# Patient Record
Sex: Female | Born: 2005 | Race: Black or African American | Hispanic: No | Marital: Single | Smoking: Never smoker
Health system: Southern US, Community
[De-identification: ages and names within clinical notes are randomized; demographics above are authoritative.]

## PROBLEM LIST (undated history)

## (undated) ENCOUNTER — Ambulatory Visit: Admission: EM | Payer: Medicaid Other | Source: Home / Self Care

## (undated) ENCOUNTER — Emergency Department (HOSPITAL_COMMUNITY): Admission: EM | Payer: Medicaid Other

## (undated) ENCOUNTER — Ambulatory Visit: Payer: Self-pay | Source: Home / Self Care

## (undated) ENCOUNTER — Ambulatory Visit: Admission: EM | Payer: Medicaid Other

## (undated) DIAGNOSIS — I10 Essential (primary) hypertension: Secondary | ICD-10-CM

## (undated) DIAGNOSIS — Z789 Other specified health status: Secondary | ICD-10-CM

## (undated) DIAGNOSIS — A599 Trichomoniasis, unspecified: Secondary | ICD-10-CM

## (undated) DIAGNOSIS — N390 Urinary tract infection, site not specified: Secondary | ICD-10-CM

## (undated) DIAGNOSIS — A749 Chlamydial infection, unspecified: Secondary | ICD-10-CM

## (undated) HISTORY — PX: NO PAST SURGERIES: SHX2092

## (undated) HISTORY — PX: TONSILLECTOMY: SUR1361

---
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
## 2023-01-09 ENCOUNTER — Encounter: Payer: Self-pay | Admitting: Obstetrics

## 2023-01-09 ENCOUNTER — Ambulatory Visit (INDEPENDENT_AMBULATORY_CARE_PROVIDER_SITE_OTHER): Payer: Medicaid Other | Admitting: Obstetrics

## 2023-01-09 DIAGNOSIS — Z0000 Encounter for general adult medical examination without abnormal findings: Secondary | ICD-10-CM

## 2023-01-09 DIAGNOSIS — I10 Essential (primary) hypertension: Secondary | ICD-10-CM | POA: Diagnosis not present

## 2023-01-09 DIAGNOSIS — Z8759 Personal history of other complications of pregnancy, childbirth and the puerperium: Secondary | ICD-10-CM | POA: Diagnosis not present

## 2023-01-09 NOTE — Progress Notes (Signed)
Pt reports seeing spots and having visual disturbances. Pt states she stopped taking her BP medication because it "makes my head feel heavy". Denies any headaches. No other concerns at this time.

## 2023-01-09 NOTE — Progress Notes (Unsigned)
Patient ID: Sheila Brennan, female   DOB: 2006-05-22, 17 y.o.   MRN: [id number]  No chief complaint on file.   HPI Sheila Brennan is a 17 y.o. female.  Visual disturbances after starting BP med.  Stopped taking medication and symptoms went away. HPI  Past Medical History:  Diagnosis Date   Chlamydia    Hypertension    Trichomonas infection    UTI (urinary tract infection)     Past Surgical History:  Procedure Laterality Date   NO PAST SURGERIES      Family History  Problem Relation Age of Onset   Hypertension Mother    Diabetes Mother    Other Father        work accident   Asthma Neg Hx    Cancer Neg Hx    Hearing loss Neg Hx    Stroke Neg Hx     Social History Social History   Tobacco Use   Smoking status: Never   Smokeless tobacco: Never  Vaping Use   Vaping Use: Never used  Substance Use Topics   Alcohol use: Not Currently    Comment: not since confirmed pregnancy   Drug use: Not Currently    No Known Allergies  Current Outpatient Medications  Medication Sig Dispense Refill   Blood Pressure Monitoring (BLOOD PRESSURE KIT) DEVI 1 kit by Does not apply route once a week. (Patient not taking: Reported on 11/04/2022) 1 each 0   enalapril (VASOTEC) 10 MG tablet Take 1 tablet (10 mg total) by mouth 2 (two) times daily. (Patient not taking: Reported on 01/09/2023) 60 tablet 11   furosemide (LASIX) 20 MG tablet Take 1 tablet (20 mg total) by mouth 2 (two) times daily. (Patient not taking: Reported on 11/04/2022) 12 tablet 0   ibuprofen (ADVIL) 600 MG tablet Take 1 tablet (600 mg total) by mouth every 6 (six) hours. (Patient not taking: Reported on 11/04/2022) 30 tablet 0   Misc. Devices (GOJJI WEIGHT SCALE) MISC 1 Device by Does not apply route every 30 (thirty) days. (Patient not taking: Reported on 11/04/2022) 1 each 0   Prenat-Fe Poly-Methfol-FA-DHA (VITAFOL ULTRA) 29-0.6-0.4-200 MG CAPS Take 1 capsule by mouth daily. (Patient not taking: Reported on 01/09/2023) 30  capsule 11   No current facility-administered medications for this visit.    Review of Systems Review of Systems Constitutional: negative for fatigue and weight loss Respiratory: negative for cough and wheezing Cardiovascular: negative for chest pain, fatigue and palpitations Gastrointestinal: negative for abdominal pain and change in bowel habits Genitourinary:negative Integument/breast: negative for nipple discharge Musculoskeletal:negative for myalgias Neurological: negative for gait problems and tremors Behavioral/Psych: negative for abusive relationship, depression Endocrine: negative for temperature intolerance      Blood pressure 131/89, pulse 82, height 5' 5"$  (1.651 m), weight 191 lb 8 oz (86.9 kg), last menstrual period 12/22/2022, not currently breastfeeding.  Physical Exam Physical Exam General:   alert  Skin:   no rash or abnormalities  Lungs:   clear to auscultation bilaterally  Heart:   regular rate and rhythm, S1, S2 normal, no murmur, click, rub or gallop  Breasts:   normal without suspicious masses, skin or nipple changes or axillary nodes  Abdomen:  normal findings: no organomegaly, soft, non-tender and no hernia  Pelvis:  External genitalia: normal general appearance Urinary system: urethral meatus normal and bladder without fullness, nontender Vaginal: normal without tenderness, induration or masses Cervix: normal appearance Adnexa: normal bimanual exam Uterus: anteverted and non-tender, normal size    ***%  of *** min visit spent on counseling and coordination of care.   Data Reviewed ***  Assessment     ***    Plan    Orders Placed This Encounter  Procedures   Ambulatory referral to Internal Medicine    Referral Priority:   Routine    Referral Type:   Consultation    Referral Reason:   Specialty Services Required    Requested Specialty:   Internal Medicine    Number of Visits Requested:   1   No orders of the defined types were placed in  this encounter.    Shelly Bombard, MD 01/09/2023 10:26 AM

## 2023-01-10 LAB — COMPREHENSIVE METABOLIC PANEL
ALT: 16 IU/L (ref 0–32)
AST: 16 IU/L (ref 0–40)
Albumin/Globulin Ratio: 1.5 (ref 1.2–2.2)
Albumin: 4.1 g/dL (ref 4.0–5.0)
Alkaline Phosphatase: 97 IU/L (ref 42–106)
BUN/Creatinine Ratio: 10 (ref 9–23)
BUN: 10 mg/dL (ref 6–20)
Bilirubin Total: 0.5 mg/dL (ref 0.0–1.2)
CO2: 22 mmol/L (ref 20–29)
Calcium: 9.6 mg/dL (ref 8.7–10.2)
Chloride: 104 mmol/L (ref 96–106)
Creatinine, Ser: 0.96 mg/dL (ref 0.57–1.00)
Globulin, Total: 2.7 g/dL (ref 1.5–4.5)
Glucose: 87 mg/dL (ref 70–99)
Potassium: 4.1 mmol/L (ref 3.5–5.2)
Sodium: 141 mmol/L (ref 134–144)
Total Protein: 6.8 g/dL (ref 6.0–8.5)
eGFR: 88 mL/min/{1.73_m2} (ref >59)

## 2023-01-10 LAB — CBC
Hematocrit: 36 % (ref 34.0–46.6)
Hemoglobin: 11.4 g/dL (ref 11.1–15.9)
MCH: 25.3 pg — ABNORMAL LOW (ref 26.6–33.0)
MCHC: 31.7 g/dL (ref 31.5–35.7)
MCV: 80 fL (ref 79–97)
Platelets: 369 10*3/uL (ref 150–450)
RBC: 4.5 x10E6/uL (ref 3.77–5.28)
RDW: 15 % (ref 11.7–15.4)
WBC: 7 10*3/uL (ref 3.4–10.8)

## 2023-01-16 ENCOUNTER — Ambulatory Visit
Admission: EM | Admit: 2023-01-16 | Discharge: 2023-01-16 | Disposition: A | Discharge status: Home / Self Care | Payer: Medicaid Other | Attending: Physician Assistant | Admitting: Physician Assistant

## 2023-01-16 DIAGNOSIS — Z113 Encounter for screening for infections with a predominantly sexual mode of transmission: Secondary | ICD-10-CM | POA: Diagnosis present

## 2023-01-16 LAB — POCT URINE PREGNANCY: Preg Test, Ur: NEGATIVE

## 2023-01-16 NOTE — ED Provider Notes (Signed)
[facility]    CSN: [id number] Arrival date & time: 01/16/23  1459      History   Chief Complaint Chief Complaint  Patient presents with   SEXUALLY TRANSMITTED DISEASE    HPI Sheila Brennan is a 17 y.o. female.   17 year old female presents for STI screening.  Patient indicates that she is currently for STI screening.  She indicates she does not have symptoms.  She relates she has not have any vaginal discharge or odor.  She declines to have HIV and RPR testing at this time.  She indicates her last period was January 17 and normal.  She indicates her last intercourse was 1 week ago and it was unprotected.  She is without fever or chills.     Past Medical History:  Diagnosis Date   Chlamydia    Hypertension    Trichomonas infection    UTI (urinary tract infection)     Patient Active Problem List   Diagnosis Date Noted   Hypertension in pregnancy, preeclampsia, severe, delivered 09/04/2022   Vaginal delivery 09/04/2022   Chronic hypertension with superimposed pre-eclampsia 09/03/2022   Excessive weight gain during pregnancy in second trimester 07/08/2022   Chronic hypertension affecting pregnancy 04/15/2022   Chlamydia trachomatis infection in pregnancy in first trimester 04/15/2022   High risk teen pregnancy 03/24/2022    Past Surgical History:  Procedure Laterality Date   NO PAST SURGERIES      OB History     Gravida  1   Para  1   Term  0   Preterm  1   AB  0   Living  1      SAB  0   IAB  0   Ectopic  0   Multiple  0   Live Births  1            Home Medications    Prior to Admission medications   Medication Sig Start Date End Date Taking? Authorizing Provider  Blood Pressure Monitoring (BLOOD PRESSURE KIT) DEVI 1 kit by Does not apply route once a week. Patient not taking: Reported on 11/04/2022 03/24/22   Constant, Peggy, MD  enalapril (VASOTEC) 10 MG tablet Take 1 tablet (10 mg total) by mouth 2 (two) times  daily. Patient not taking: Reported on 01/09/2023 11/04/22   Shelly Bombard, MD  furosemide (LASIX) 20 MG tablet Take 1 tablet (20 mg total) by mouth 2 (two) times daily. Patient not taking: Reported on 11/04/2022 09/07/22   Chancy Milroy, MD  ibuprofen (ADVIL) 600 MG tablet Take 1 tablet (600 mg total) by mouth every 6 (six) hours. Patient not taking: Reported on 11/04/2022 09/07/22   Chancy Milroy, MD  Misc. Devices (GOJJI WEIGHT SCALE) MISC 1 Device by Does not apply route every 30 (thirty) days. Patient not taking: Reported on 11/04/2022 03/24/22   Constant, Peggy, MD  Prenat-Fe Poly-Methfol-FA-DHA (VITAFOL ULTRA) 29-0.6-0.4-200 MG CAPS Take 1 capsule by mouth daily. Patient not taking: Reported on 01/09/2023 04/15/22   Leftwich-Kirby, Kathie Dike, CNM    Family History Family History  Problem Relation Age of Onset   Hypertension Mother    Diabetes Mother    Other Father        work accident   Asthma Neg Hx    Cancer Neg Hx    Hearing loss Neg Hx    Stroke Neg Hx     Social History Social History   Tobacco Use   Smoking status:  Never   Smokeless tobacco: Never  Vaping Use   Vaping Use: Never used  Substance Use Topics   Alcohol use: Not Currently    Comment: not since confirmed pregnancy   Drug use: Not Currently     Allergies   Patient has no known allergies.   Review of Systems Review of Systems   Physical Exam Triage Vital Signs ED Triage Vitals  Enc Vitals Group     BP 01/16/23 1610 (!) 149/99     Pulse Rate 01/16/23 1610 68     Resp 01/16/23 1610 16     Temp 01/16/23 1610 98.3 F (36.8 C)     Temp Source 01/16/23 1610 Oral     SpO2 01/16/23 1610 98 %     Weight --      Height --      Head Circumference --      Peak Flow --      Pain Score 01/16/23 1611 0     Pain Loc --      Pain Edu? --      Excl. in [place]? --    No data found.  Updated Vital Signs BP (!) 149/99 (BP Location: Left Arm)   Pulse 68   Temp 98.3 F (36.8 C) (Oral)   Resp 16    LMP 12/10/2022 (Approximate)   SpO2 98%   Breastfeeding No   Visual Acuity Right Eye Distance:   Left Eye Distance:   Bilateral Distance:    Right Eye Near:   Left Eye Near:    Bilateral Near:     Physical Exam Constitutional:      Appearance: Normal appearance.  Neurological:     Mental Status: She is alert.      UC Treatments / Results  Labs (all labs ordered are listed, but only abnormal results are displayed) Labs Reviewed  POCT URINE PREGNANCY  CERVICOVAGINAL ANCILLARY ONLY    EKG   Radiology No results found.  Procedures Procedures (including critical care time)  Medications Ordered in UC Medications - No data to display  Initial Impression / Assessment and Plan / UC Course  I have reviewed the triage vital signs and the nursing notes.  Pertinent labs & imaging results that were available during my care of the patient were reviewed by me and considered in my medical decision making (see chart for details).    Plan: The diagnosis be treated with the following: 1.  Screening for STI: A.  Treatment may be modified depending on results of the STI testing. 2.  Follow-up PCP return to urgent care as needed. Final Clinical Impressions(s) / UC Diagnoses   Final diagnoses:  Routine screening for STI (sexually transmitted infection)     Discharge Instructions      To be completed in 48 hours.  If you do not get a call from this office that indicates labs are negative.  Log onto MyChart to be the test results with the post in 48 hours.  Advised follow-up PCP or return to urgent care as needed.    ED Prescriptions   None    PDMP not reviewed this encounter.   Nyoka Lint, PA-C 01/16/23 1630

## 2023-01-16 NOTE — ED Triage Notes (Signed)
Pt requesting routine STD checkup. Denies sx's.

## 2023-01-16 NOTE — Discharge Instructions (Addendum)
To be completed in 48 hours.  If you do not get a call from this office that indicates labs are negative.  Log onto MyChart to be the test results with the post in 48 hours.  Advised follow-up PCP or return to urgent care as needed.

## 2023-01-19 ENCOUNTER — Telehealth (HOSPITAL_COMMUNITY): Payer: Self-pay | Admitting: Emergency Medicine

## 2023-01-19 LAB — CERVICOVAGINAL ANCILLARY ONLY
Bacterial Vaginitis (gardnerella): POSITIVE — AB
Candida Glabrata: NEGATIVE
Candida Vaginitis: NEGATIVE
Chlamydia: NEGATIVE
Comment: NEGATIVE
Comment: NEGATIVE
Comment: NEGATIVE
Comment: NEGATIVE
Comment: NEGATIVE
Comment: NORMAL
Neisseria Gonorrhea: NEGATIVE
Trichomonas: NEGATIVE

## 2023-01-19 MED ORDER — METRONIDAZOLE 500 MG PO TABS: 500.0000 mg | ORAL_TABLET | Freq: Two times a day (BID) | ORAL | 0 refills | Status: DC

## 2023-02-10 ENCOUNTER — Ambulatory Visit
Admission: EM | Admit: 2023-02-10 | Discharge: 2023-02-10 | Disposition: A | Discharge status: Home / Self Care | Payer: Medicaid Other | Attending: Internal Medicine | Admitting: Internal Medicine

## 2023-02-10 ENCOUNTER — Encounter: Payer: Self-pay | Admitting: *Deleted

## 2023-02-10 ENCOUNTER — Other Ambulatory Visit: Payer: Self-pay

## 2023-02-10 DIAGNOSIS — J029 Acute pharyngitis, unspecified: Secondary | ICD-10-CM | POA: Insufficient documentation

## 2023-02-10 DIAGNOSIS — R0981 Nasal congestion: Secondary | ICD-10-CM | POA: Insufficient documentation

## 2023-02-10 LAB — POCT RAPID STREP A (OFFICE): Rapid Strep A Screen: NEGATIVE

## 2023-02-10 LAB — POCT MONO SCREEN (KUC): Mono, POC: NEGATIVE

## 2023-02-10 NOTE — Discharge Instructions (Signed)
Rapid mono and rapid strep test were negative.  Throat culture is pending.  Will call if it is abnormal.  I have provided ear, nose, throat specialist contact information for you to call.  Follow-up if any symptoms persist or worsen.

## 2023-02-10 NOTE — ED Provider Notes (Signed)
[facility]    CSN: [id number] Arrival date & time: 02/10/23  [id number]      History   Chief Complaint Chief Complaint  Patient presents with   Sore Throat    HPI Sheila Brennan is a 17 y.o. female.   Patient presents with sore throat and nasal congestion that started last night.  She denies cough or fever.  Denies any known sick contacts.  Patient reports that she has been having intermittent issues with her tonsils for 6 months to a year.  She states that when she brushes her teeth, "her tonsils are bleeding".  Patient is requesting referral to ear, nose, throat specialist for further evaluation and management of this.   Sore Throat    Past Medical History:  Diagnosis Date   Chlamydia    Hypertension    Trichomonas infection    UTI (urinary tract infection)     Patient Active Problem List   Diagnosis Date Noted   Hypertension in pregnancy, preeclampsia, severe, delivered 09/04/2022   Vaginal delivery 09/04/2022   Chronic hypertension with superimposed pre-eclampsia 09/03/2022   Excessive weight gain during pregnancy in second trimester 07/08/2022   Chronic hypertension affecting pregnancy 04/15/2022   Chlamydia trachomatis infection in pregnancy in first trimester 04/15/2022   High risk teen pregnancy 03/24/2022    Past Surgical History:  Procedure Laterality Date   NO PAST SURGERIES      OB History     Gravida  1   Para  1   Term  0   Preterm  1   AB  0   Living  1      SAB  0   IAB  0   Ectopic  0   Multiple  0   Live Births  1            Home Medications    Prior to Admission medications   Medication Sig Start Date End Date Taking? Authorizing Provider  Blood Pressure Monitoring (BLOOD PRESSURE KIT) DEVI 1 kit by Does not apply route once a week. Patient not taking: Reported on 11/04/2022 03/24/22   Constant, Peggy, MD  enalapril (VASOTEC) 10 MG tablet Take 1 tablet (10 mg total) by mouth 2 (two) times daily. Patient  not taking: Reported on 01/09/2023 11/04/22   Shelly Bombard, MD  furosemide (LASIX) 20 MG tablet Take 1 tablet (20 mg total) by mouth 2 (two) times daily. Patient not taking: Reported on 11/04/2022 09/07/22   Chancy Milroy, MD  ibuprofen (ADVIL) 600 MG tablet Take 1 tablet (600 mg total) by mouth every 6 (six) hours. Patient not taking: Reported on 11/04/2022 09/07/22   Chancy Milroy, MD  metroNIDAZOLE (FLAGYL) 500 MG tablet Take 1 tablet (500 mg total) by mouth 2 (two) times daily. 01/19/23   LampteyMyrene Galas, MD  Misc. Devices (GOJJI WEIGHT SCALE) MISC 1 Device by Does not apply route every 30 (thirty) days. Patient not taking: Reported on 11/04/2022 03/24/22   Constant, Peggy, MD  Prenat-Fe Poly-Methfol-FA-DHA (VITAFOL ULTRA) 29-0.6-0.4-200 MG CAPS Take 1 capsule by mouth daily. Patient not taking: Reported on 01/09/2023 04/15/22   Elvera Maria, CNM    Family History Family History  Problem Relation Age of Onset   Hypertension Mother    Diabetes Mother    Other Father        work accident   Asthma Neg Hx    Cancer Neg Hx    Hearing loss Neg Hx  Stroke Neg Hx     Social History Social History   Tobacco Use   Smoking status: Never   Smokeless tobacco: Never  Vaping Use   Vaping Use: Never used  Substance Use Topics   Alcohol use: Not Currently    Comment: not since confirmed pregnancy   Drug use: Not Currently     Allergies   Patient has no known allergies.   Review of Systems Review of Systems Per HPI  Physical Exam Triage Vital Signs ED Triage Vitals  Enc Vitals Group     BP 02/10/23 0951 134/87     Pulse Rate 02/10/23 0951 80     Resp 02/10/23 0951 16     Temp 02/10/23 0951 98.1 F (36.7 C)     Temp src --      SpO2 02/10/23 0951 98 %     Weight --      Height --      Head Circumference --      Peak Flow --      Pain Score 02/10/23 0949 6     Pain Loc --      Pain Edu? --      Excl. in [place]? --    No data found.  Updated Vital  Signs BP 134/87   Pulse 80   Temp 98.1 F (36.7 C)   Resp 16   LMP 02/10/2023 (Approximate)   SpO2 98%   Visual Acuity Right Eye Distance:   Left Eye Distance:   Bilateral Distance:    Right Eye Near:   Left Eye Near:    Bilateral Near:     Physical Exam Constitutional:      General: She is not in acute distress.    Appearance: Normal appearance. She is not toxic-appearing or diaphoretic.  HENT:     Head: Normocephalic and atraumatic.     Right Ear: Tympanic membrane and ear canal normal.     Left Ear: Tympanic membrane and ear canal normal.     Nose: Congestion present.     Mouth/Throat:     Mouth: Mucous membranes are moist.     Pharynx: Posterior oropharyngeal erythema present. No pharyngeal swelling, oropharyngeal exudate or uvula swelling.     Tonsils: No tonsillar exudate or tonsillar abscesses.  Eyes:     Extraocular Movements: Extraocular movements intact.     Conjunctiva/sclera: Conjunctivae normal.     Pupils: Pupils are equal, round, and reactive to light.  Cardiovascular:     Rate and Rhythm: Normal rate and regular rhythm.     Pulses: Normal pulses.     Heart sounds: Normal heart sounds.  Pulmonary:     Effort: Pulmonary effort is normal. No respiratory distress.     Breath sounds: Normal breath sounds. No wheezing.  Abdominal:     General: Abdomen is flat. Bowel sounds are normal.     Palpations: Abdomen is soft.  Musculoskeletal:        General: Normal range of motion.     Cervical back: Normal range of motion.  Skin:    General: Skin is warm and dry.  Neurological:     General: No focal deficit present.     Mental Status: She is alert and oriented to person, place, and time. Mental status is at baseline.  Psychiatric:        Mood and Affect: Mood normal.        Behavior: Behavior normal.      UC Treatments / Results  Labs (all labs ordered are listed, but only abnormal results are displayed) Labs Reviewed  CULTURE, GROUP A STREP [facility])   POCT RAPID STREP A (OFFICE)  POCT MONO SCREEN [facility])    EKG   Radiology No results found.  Procedures Procedures (including critical care time)  Medications Ordered in UC Medications - No data to display  Initial Impression / Assessment and Plan / UC Course  I have reviewed the triage vital signs and the nursing notes.  Pertinent labs & imaging results that were available during my care of the patient were reviewed by me and considered in my medical decision making (see chart for details).     Differential diagnoses include viral upper respiratory infection versus just allergic rhinitis.  Rapid strep was negative.  Throat culture pending.  Rapid mono negative.  Advised supportive care and symptom management.  No signs of tonsillar bleeding, inflammation, abscess on exam.  Patient requesting information for ENT specialty so this was provided for patient.  Advised strict return precautions.  Patient verbalized understanding and was agreeable with plan. Final Clinical Impressions(s) / UC Diagnoses   Final diagnoses:  Nasal congestion  Sore throat     Discharge Instructions      Rapid mono and rapid strep test were negative.  Throat culture is pending.  Will call if it is abnormal.  I have provided ear, nose, throat specialist contact information for you to call.  Follow-up if any symptoms persist or worsen.     ED Prescriptions   None    PDMP not reviewed this encounter.   Teodora Medici, [place] 02/10/23 1041

## 2023-02-10 NOTE — ED Triage Notes (Signed)
Pt reports her tonsils are bleeding and it has been going on for a while. Pt thinks it is time for her tonsils to removed. Pt does not have a PCP and needs a referral to have tonsils removed.

## 2023-02-12 LAB — CULTURE, GROUP A STREP (THRC)

## 2023-02-15 ENCOUNTER — Ambulatory Visit
Admission: EM | Admit: 2023-02-15 | Discharge: 2023-02-15 | Disposition: A | Discharge status: Home / Self Care | Payer: Medicaid Other | Attending: Internal Medicine | Admitting: Internal Medicine

## 2023-02-15 DIAGNOSIS — N898 Other specified noninflammatory disorders of vagina: Secondary | ICD-10-CM | POA: Diagnosis present

## 2023-02-15 MED ORDER — FLUCONAZOLE 150 MG PO TABS: 150.0000 mg | ORAL_TABLET | ORAL | 0 refills | Status: DC

## 2023-02-15 NOTE — Discharge Instructions (Signed)
I have sent you Diflucan to help treat yeast infection.  Vaginal swab is pending.  Will call if it is abnormal.  Refrain from sexual activity until test results and treatment are complete.  Follow-up if any symptoms persist or worsen.

## 2023-02-15 NOTE — ED Triage Notes (Signed)
Pt presents to the office for vaginal discharge and itching. Pt thinks she has a yeast infection and would like to check. Pt was diagnosis with BV on 01/16/2023.

## 2023-02-15 NOTE — ED Provider Notes (Signed)
[facility]    CSN: [id number] Arrival date & time: 02/15/23  1456      History   Chief Complaint Chief Complaint  Patient presents with   Vaginal Discharge    HPI Sheila Brennan is a 17 y.o. female.   Patient presents with white vaginal discharge.  She is a poor historian and is not able to discern when it started.  Although, she is very concerned about a yeast infection given that she thinks that it started after she stopped taking metronidazole for BV in February.  Patient was seen on 2/23 and tested positive for bacterial vaginosis where she was prescribed metronidazole on 2/26.  She reports that she took this completely.  Last menstrual cycle was the week of 02/10/2023.  Patient denies any sexual encounter since last visit in February where she was diagnosed with bacterial vaginosis.  She has no concern for STD.  Patient has very mild dysuria but denies urinary frequency, abdominal pain, back pain, pelvic pain, fever.   Vaginal Discharge   Past Medical History:  Diagnosis Date   Chlamydia    Hypertension    Trichomonas infection    UTI (urinary tract infection)     Patient Active Problem List   Diagnosis Date Noted   Hypertension in pregnancy, preeclampsia, severe, delivered 09/04/2022   Vaginal delivery 09/04/2022   Chronic hypertension with superimposed pre-eclampsia 09/03/2022   Excessive weight gain during pregnancy in second trimester 07/08/2022   Chronic hypertension affecting pregnancy 04/15/2022   Chlamydia trachomatis infection in pregnancy in first trimester 04/15/2022   High risk teen pregnancy 03/24/2022    Past Surgical History:  Procedure Laterality Date   NO PAST SURGERIES      OB History     Gravida  1   Para  1   Term  0   Preterm  1   AB  0   Living  1      SAB  0   IAB  0   Ectopic  0   Multiple  0   Live Births  1            Home Medications    Prior to Admission medications   Medication Sig Start  Date End Date Taking? Authorizing Provider  fluconazole (DIFLUCAN) 150 MG tablet Take 1 tablet (150 mg total) by mouth every 3 (three) days. Take 1 pill today.  May take second pill in 3 days if no resolution of symptoms with first pill. 02/15/23  Yes Maranatha Grossi, [place], FNP  Blood Pressure Monitoring (BLOOD PRESSURE KIT) DEVI 1 kit by Does not apply route once a week. Patient not taking: Reported on 11/04/2022 03/24/22   Constant, Peggy, MD  enalapril (VASOTEC) 10 MG tablet Take 1 tablet (10 mg total) by mouth 2 (two) times daily. Patient not taking: Reported on 01/09/2023 11/04/22   Shelly Bombard, MD  furosemide (LASIX) 20 MG tablet Take 1 tablet (20 mg total) by mouth 2 (two) times daily. Patient not taking: Reported on 11/04/2022 09/07/22   Chancy Milroy, MD  ibuprofen (ADVIL) 600 MG tablet Take 1 tablet (600 mg total) by mouth every 6 (six) hours. Patient not taking: Reported on 11/04/2022 09/07/22   Chancy Milroy, MD  metroNIDAZOLE (FLAGYL) 500 MG tablet Take 1 tablet (500 mg total) by mouth 2 (two) times daily. 01/19/23   LampteyMyrene Galas, MD  Misc. Devices (GOJJI WEIGHT SCALE) MISC 1 Device by Does not apply route every 30 (thirty)  days. Patient not taking: Reported on 11/04/2022 03/24/22   Constant, Peggy, MD  Prenat-Fe Poly-Methfol-FA-DHA (VITAFOL ULTRA) 29-0.6-0.4-200 MG CAPS Take 1 capsule by mouth daily. Patient not taking: Reported on 01/09/2023 04/15/22   Leftwich-Kirby, Kathie Dike, CNM    Family History Family History  Problem Relation Age of Onset   Hypertension Mother    Diabetes Mother    Other Father        work accident   Asthma Neg Hx    Cancer Neg Hx    Hearing loss Neg Hx    Stroke Neg Hx     Social History Social History   Tobacco Use   Smoking status: Never   Smokeless tobacco: Never  Vaping Use   Vaping Use: Never used  Substance Use Topics   Alcohol use: Not Currently    Comment: not since confirmed pregnancy   Drug use: Not Currently     Allergies    Patient has no known allergies.   Review of Systems Review of Systems Per HPI  Physical Exam Triage Vital Signs ED Triage Vitals [02/15/23 1512]  Enc Vitals Group     BP (!) 158/89     Pulse Rate 93     Resp 16     Temp 98.7 F (37.1 C)     Temp Source Oral     SpO2 98 %     Weight      Height      Head Circumference      Peak Flow      Pain Score      Pain Loc      Pain Edu?      Excl. in [place]?    No data found.  Updated Vital Signs BP (!) 158/89 (BP Location: Left Arm)   Pulse 93   Temp 98.7 F (37.1 C) (Oral)   Resp 16   LMP 02/10/2023 (Approximate)   SpO2 98%   Visual Acuity Right Eye Distance:   Left Eye Distance:   Bilateral Distance:    Right Eye Near:   Left Eye Near:    Bilateral Near:     Physical Exam Constitutional:      General: She is not in acute distress.    Appearance: Normal appearance. She is not toxic-appearing or diaphoretic.  HENT:     Head: Normocephalic and atraumatic.  Eyes:     Extraocular Movements: Extraocular movements intact.     Conjunctiva/sclera: Conjunctivae normal.  Pulmonary:     Effort: Pulmonary effort is normal.  Genitourinary:    Comments: Deferred with shared decision making.  Self swab performed. Neurological:     General: No focal deficit present.     Mental Status: She is alert and oriented to person, place, and time. Mental status is at baseline.  Psychiatric:        Mood and Affect: Mood normal.        Behavior: Behavior normal.        Thought Content: Thought content normal.        Judgment: Judgment normal.      UC Treatments / Results  Labs (all labs ordered are listed, but only abnormal results are displayed) Labs Reviewed  CERVICOVAGINAL ANCILLARY ONLY    EKG   Radiology No results found.  Procedures Procedures (including critical care time)  Medications Ordered in UC Medications - No data to display  Initial Impression / Assessment and Plan / UC Course  I have reviewed the  triage vital  signs and the nursing notes.  Pertinent labs & imaging results that were available during my care of the patient were reviewed by me and considered in my medical decision making (see chart for details).     Patient reporting only mild dysuria and is adamant that it is due to vaginal discharge.  Therefore, UA was deferred.  Patient just completed menstrual cycle so urine pregnancy was deferred.  Symptoms appear consistent with vaginal yeast infection especially given they may have started after antibiotic administration.  Therefore, will opt to treat with Diflucan.  Awaiting cervicovaginal swab.  She denies exposure or concern for STDs so this testing was deferred.  Advised strict follow-up if any symptoms persist or worsen.  Patient verbalized understanding and was agreeable with plan. Final Clinical Impressions(s) / UC Diagnoses   Final diagnoses:  Vaginal discharge     Discharge Instructions      I have sent you Diflucan to help treat yeast infection.  Vaginal swab is pending.  Will call if it is abnormal.  Refrain from sexual activity until test results and treatment are complete.  Follow-up if any symptoms persist or worsen.    ED Prescriptions     Medication Sig Dispense Auth. Provider   fluconazole (DIFLUCAN) 150 MG tablet Take 1 tablet (150 mg total) by mouth every 3 (three) days. Take 1 pill today.  May take second pill in 3 days if no resolution of symptoms with first pill. 2 tablet [place], Michele Rockers, [place]      PDMP not reviewed this encounter.   Teodora Medici, [place] 02/15/23 [phone]

## 2023-02-16 LAB — CERVICOVAGINAL ANCILLARY ONLY
Bacterial Vaginitis (gardnerella): NEGATIVE
Candida Glabrata: NEGATIVE
Candida Vaginitis: NEGATIVE
Comment: NEGATIVE
Comment: NEGATIVE
Comment: NEGATIVE

## 2023-03-19 ENCOUNTER — Encounter: Payer: Self-pay | Admitting: Emergency Medicine

## 2023-03-19 ENCOUNTER — Ambulatory Visit
Admission: EM | Admit: 2023-03-19 | Discharge: 2023-03-19 | Disposition: A | Discharge status: Home / Self Care | Payer: Medicaid Other | Attending: Internal Medicine | Admitting: Internal Medicine

## 2023-03-19 ENCOUNTER — Other Ambulatory Visit: Payer: Self-pay

## 2023-03-19 DIAGNOSIS — N898 Other specified noninflammatory disorders of vagina: Secondary | ICD-10-CM

## 2023-03-19 DIAGNOSIS — Z113 Encounter for screening for infections with a predominantly sexual mode of transmission: Secondary | ICD-10-CM | POA: Diagnosis not present

## 2023-03-19 NOTE — ED Provider Notes (Signed)
[facility]    CSN: [id number] Arrival date & time: 03/19/23  1534      History   Chief Complaint Chief Complaint  Patient presents with   Vaginal Discharge    HPI Sheila Brennan is a 17 y.o. female.   Patient presents with a milky white vaginal discharge that started about 2 days ago.  Patient denies any exposure to STD but has had unprotected sexual intercourse prior to symptoms starting.  Denies any associated dysuria, urinary frequency, abdominal pain, vomiting, back pain, fever.  Last menstrual cycle completed approximately 1 week ago. Patient requesting STD testing today.    Vaginal Discharge   Past Medical History:  Diagnosis Date   Chlamydia    Hypertension    Trichomonas infection    UTI (urinary tract infection)     Patient Active Problem List   Diagnosis Date Noted   Hypertension in pregnancy, preeclampsia, severe, delivered 09/04/2022   Vaginal delivery 09/04/2022   Chronic hypertension with superimposed pre-eclampsia 09/03/2022   Excessive weight gain during pregnancy in second trimester 07/08/2022   Chronic hypertension affecting pregnancy 04/15/2022   Chlamydia trachomatis infection in pregnancy in first trimester 04/15/2022   High risk teen pregnancy 03/24/2022    Past Surgical History:  Procedure Laterality Date   NO PAST SURGERIES      OB History     Gravida  1   Para  1   Term  0   Preterm  1   AB  0   Living  1      SAB  0   IAB  0   Ectopic  0   Multiple  0   Live Births  1            Home Medications    Prior to Admission medications   Medication Sig Start Date End Date Taking? Authorizing Provider  Blood Pressure Monitoring (BLOOD PRESSURE KIT) DEVI 1 kit by Does not apply route once a week. Patient not taking: Reported on 11/04/2022 03/24/22   Constant, Peggy, MD  enalapril (VASOTEC) 10 MG tablet Take 1 tablet (10 mg total) by mouth 2 (two) times daily. Patient not taking: Reported on 01/09/2023  11/04/22   Brock Bad, MD  fluconazole (DIFLUCAN) 150 MG tablet Take 1 tablet (150 mg total) by mouth every 3 (three) days. Take 1 pill today.  May take second pill in 3 days if no resolution of symptoms with first pill. 02/15/23   Gustavus Bryant, FNP  furosemide (LASIX) 20 MG tablet Take 1 tablet (20 mg total) by mouth 2 (two) times daily. Patient not taking: Reported on 11/04/2022 09/07/22   Hermina Staggers, MD  ibuprofen (ADVIL) 600 MG tablet Take 1 tablet (600 mg total) by mouth every 6 (six) hours. Patient not taking: Reported on 11/04/2022 09/07/22   Hermina Staggers, MD  metroNIDAZOLE (FLAGYL) 500 MG tablet Take 1 tablet (500 mg total) by mouth 2 (two) times daily. 01/19/23   LampteyBritta Mccreedy, MD  Misc. Devices (GOJJI WEIGHT SCALE) MISC 1 Device by Does not apply route every 30 (thirty) days. Patient not taking: Reported on 11/04/2022 03/24/22   Constant, Peggy, MD  Prenat-Fe Poly-Methfol-FA-DHA (VITAFOL ULTRA) 29-0.6-0.4-200 MG CAPS Take 1 capsule by mouth daily. Patient not taking: Reported on 01/09/2023 04/15/22   Hurshel Party, CNM    Family History Family History  Problem Relation Age of Onset   Hypertension Mother    Diabetes Mother    Other  Father        work accident   Asthma Neg Hx    Cancer Neg Hx    Hearing loss Neg Hx    Stroke Neg Hx     Social History Social History   Tobacco Use   Smoking status: Never   Smokeless tobacco: Never  Vaping Use   Vaping Use: Never used  Substance Use Topics   Alcohol use: Not Currently    Comment: not since confirmed pregnancy   Drug use: Not Currently     Allergies   Patient has no known allergies.   Review of Systems Review of Systems Per HPI  Physical Exam Triage Vital Signs ED Triage Vitals  Enc Vitals Group     BP 03/19/23 1549 124/82     Pulse Rate 03/19/23 1549 85     Resp 03/19/23 1549 16     Temp 03/19/23 1549 98.3 F (36.8 C)     Temp Source 03/19/23 1549 Oral     SpO2 03/19/23 1549 97  %     Weight 03/19/23 1550 195 lb (88.5 kg)     Height 03/19/23 1550  (1.651 m)     Head Circumference --      Peak Flow --      Pain Score 03/19/23 1550 0     Pain Loc --      Pain Edu? --      Excl. in [place]? --    No data found.  Updated Vital Signs BP 124/82 (BP Location: Left Arm)   Pulse 85   Temp 98.3 F (36.8 C) (Oral)   Resp 16   Ht  (1.651 m)   Wt 195 lb (88.5 kg)   LMP 03/12/2023   SpO2 97%   Breastfeeding No   BMI 32.45 kg/m   Visual Acuity Right Eye Distance:   Left Eye Distance:   Bilateral Distance:    Right Eye Near:   Left Eye Near:    Bilateral Near:     Physical Exam Constitutional:      General: She is not in acute distress.    Appearance: Normal appearance. She is not toxic-appearing or diaphoretic.  HENT:     Head: Normocephalic and atraumatic.  Eyes:     Extraocular Movements: Extraocular movements intact.     Conjunctiva/sclera: Conjunctivae normal.  Pulmonary:     Effort: Pulmonary effort is normal.  Genitourinary:    Comments: Deferred with shared decision making. Self swab performed.  Neurological:     General: No focal deficit present.     Mental Status: She is alert and oriented to person, place, and time. Mental status is at baseline.  Psychiatric:        Mood and Affect: Mood normal.        Behavior: Behavior normal.        Thought Content: Thought content normal.        Judgment: Judgment normal.      UC Treatments / Results  Labs (all labs ordered are listed, but only abnormal results are displayed) Labs Reviewed  RPR  HIV ANTIBODY (ROUTINE TESTING W REFLEX)  CERVICOVAGINAL ANCILLARY ONLY    EKG   Radiology No results found.  Procedures Procedures (including critical care time)  Medications Ordered in UC Medications - No data to display  Initial Impression / Assessment and Plan / UC Course  I have reviewed the triage vital signs and the nursing notes.  Pertinent labs & imaging results  that were  available during my care of the patient were reviewed by me and considered in my medical decision making (see chart for details).     Cervicovaginal swab, HIV, RPR test pending.  Given no confirmed exposure to STD, will await results for further treatment.  Patient advised to refrain from sexual activity until test results and treatment are complete.  Patient verbalized understanding and was agreeable with plan. Final Clinical Impressions(s) / UC Diagnoses   Final diagnoses:  Vaginal discharge  Screening examination for venereal disease     Discharge Instructions      Vaginal swab and STD testing is pending.  We will call if anything is positive and treat as appropriate.  We ask that you refrain from sexual activity until test results and treatment are complete.    ED Prescriptions   None    PDMP not reviewed this encounter.   Gustavus Bryant, [place] 03/19/23 1610

## 2023-03-19 NOTE — ED Triage Notes (Signed)
Patient requesting STI testing and bloodwork due to vaginal discharge.  Denies any odor or color to the discharge.

## 2023-03-19 NOTE — Discharge Instructions (Signed)
Vaginal swab and STD testing is pending.  We will call if anything is positive and treat as appropriate.  We ask that you refrain from sexual activity until test results and treatment are complete.

## 2023-03-20 LAB — CERVICOVAGINAL ANCILLARY ONLY
Bacterial Vaginitis (gardnerella): NEGATIVE
Candida Glabrata: NEGATIVE
Candida Vaginitis: NEGATIVE
Chlamydia: NEGATIVE
Comment: NEGATIVE
Comment: NEGATIVE
Comment: NEGATIVE
Comment: NEGATIVE
Comment: NEGATIVE
Comment: NORMAL
Neisseria Gonorrhea: NEGATIVE
Trichomonas: NEGATIVE

## 2023-03-20 LAB — HIV ANTIBODY (ROUTINE TESTING W REFLEX): HIV Screen 4th Generation wRfx: NONREACTIVE

## 2023-03-20 LAB — RPR: RPR Ser Ql: NONREACTIVE

## 2023-04-27 ENCOUNTER — Ambulatory Visit
Admission: EM | Admit: 2023-04-27 | Discharge: 2023-04-27 | Disposition: A | Discharge status: Home / Self Care | Payer: Medicaid Other | Attending: Family Medicine | Admitting: Family Medicine

## 2023-04-27 DIAGNOSIS — Z113 Encounter for screening for infections with a predominantly sexual mode of transmission: Secondary | ICD-10-CM | POA: Diagnosis present

## 2023-04-27 LAB — POCT URINALYSIS DIP (MANUAL ENTRY)
Bilirubin, UA: NEGATIVE
Glucose, UA: NEGATIVE mg/dL
Ketones, POC UA: NEGATIVE mg/dL
Nitrite, UA: NEGATIVE
Protein Ur, POC: 30 mg/dL — AB
Spec Grav, UA: 1.02 (ref 1.010–1.025)
Urobilinogen, UA: 0.2 E.U./dL
pH, UA: 7 (ref 5.0–8.0)

## 2023-04-27 LAB — POCT URINE PREGNANCY: Preg Test, Ur: NEGATIVE

## 2023-04-27 NOTE — ED Triage Notes (Signed)
Pt requesting STD testing. No known exposure and no symptoms. Pt requesting vaginal swab and bloodwork.

## 2023-04-27 NOTE — ED Provider Notes (Signed)
[facility]    CSN: [id number] Arrival date & time: 04/27/23  0800      History   Chief Complaint No chief complaint on file.   HPI Sheila Brennan is a 17 y.o. female.   HPI Patient presents today for routine STD testing.  Patient has a history of chlamydia and trichomonas infections.  Denies any symptoms or any knowledge of any recent exposure.  Patient would also like HIV and RPR testing. Past Medical History:  Diagnosis Date   Chlamydia    Hypertension    Trichomonas infection    UTI (urinary tract infection)     Patient Active Problem List   Diagnosis Date Noted   Hypertension in pregnancy, preeclampsia, severe, delivered 09/04/2022   Vaginal delivery 09/04/2022   Chronic hypertension with superimposed pre-eclampsia 09/03/2022   Excessive weight gain during pregnancy in second trimester 07/08/2022   Chronic hypertension affecting pregnancy 04/15/2022   Chlamydia trachomatis infection in pregnancy in first trimester 04/15/2022   High risk teen pregnancy 03/24/2022    Past Surgical History:  Procedure Laterality Date   NO PAST SURGERIES      OB History     Gravida  1   Para  1   Term  0   Preterm  1   AB  0   Living  1      SAB  0   IAB  0   Ectopic  0   Multiple  0   Live Births  1            Home Medications    Prior to Admission medications   Medication Sig Start Date End Date Taking? Authorizing Provider  Blood Pressure Monitoring (BLOOD PRESSURE KIT) DEVI 1 kit by Does not apply route once a week. Patient not taking: Reported on 11/04/2022 03/24/22   Constant, Peggy, MD  enalapril (VASOTEC) 10 MG tablet Take 1 tablet (10 mg total) by mouth 2 (two) times daily. Patient not taking: Reported on 01/09/2023 11/04/22   Brock Bad, MD  fluconazole (DIFLUCAN) 150 MG tablet Take 1 tablet (150 mg total) by mouth every 3 (three) days. Take 1 pill today.  May take second pill in 3 days if no resolution of symptoms with first  pill. 02/15/23   Gustavus Bryant, FNP  furosemide (LASIX) 20 MG tablet Take 1 tablet (20 mg total) by mouth 2 (two) times daily. Patient not taking: Reported on 11/04/2022 09/07/22   Hermina Staggers, MD  ibuprofen (ADVIL) 600 MG tablet Take 1 tablet (600 mg total) by mouth every 6 (six) hours. Patient not taking: Reported on 11/04/2022 09/07/22   Hermina Staggers, MD  metroNIDAZOLE (FLAGYL) 500 MG tablet Take 1 tablet (500 mg total) by mouth 2 (two) times daily. 01/19/23   LampteyBritta Mccreedy, MD  Misc. Devices (GOJJI WEIGHT SCALE) MISC 1 Device by Does not apply route every 30 (thirty) days. Patient not taking: Reported on 11/04/2022 03/24/22   Constant, Peggy, MD  Prenat-Fe Poly-Methfol-FA-DHA (VITAFOL ULTRA) 29-0.6-0.4-200 MG CAPS Take 1 capsule by mouth daily. Patient not taking: Reported on 01/09/2023 04/15/22   Hurshel Party, CNM    Family History Family History  Problem Relation Age of Onset   Hypertension Mother    Diabetes Mother    Other Father        work accident   Asthma Neg Hx    Cancer Neg Hx    Hearing loss Neg Hx    Stroke Neg  Hx     Social History Social History   Tobacco Use   Smoking status: Never   Smokeless tobacco: Never  Vaping Use   Vaping Use: Never used  Substance Use Topics   Alcohol use: Not Currently    Comment: not since confirmed pregnancy   Drug use: Not Currently     Allergies   Patient has no known allergies.   Review of Systems Review of Systems Pertinent negatives listed in HPI    Physical Exam Triage Vital Signs ED Triage Vitals [04/27/23 0806]  Enc Vitals Group     BP      Pulse      Resp      Temp      Temp src      SpO2      Weight      Height      Head Circumference      Peak Flow      Pain Score 0     Pain Loc      Pain Edu?      Excl. in GC?    No data found.  Updated Vital Signs BP 129/89   Pulse 86   Temp 97.6 F (36.4 C) (Oral)   Resp 18   LMP 04/20/2023 Comment: pt states her period blood is  brown - recently took plan B  SpO2 98%   Breastfeeding No   Visual Acuity Right Eye Distance:   Left Eye Distance:   Bilateral Distance:    Right Eye Near:   Left Eye Near:    Bilateral Near:     Physical Exam General appearance: Alert, well developed, well nourished, cooperative  Head: Normocephalic, without obvious abnormality, atraumatic Heart: Rate and rhythm normal. Respiratory: Respirations even and unlabored, normal respiratory rate Extremities: No gross deformities Skin: Skin color, texture, turgor normal. No rashes seen  Psych: Appropriate mood and affect.   UC Treatments / Results  Labs (all labs ordered are listed, but only abnormal results are displayed) Labs Reviewed  POCT URINALYSIS DIP (MANUAL ENTRY) - Abnormal; Notable for the following components:      Result Value   Clarity, UA turbid (*)    Blood, UA large (*)    Protein Ur, POC =30 (*)    Leukocytes, UA Small (1+) (*)    All other components within normal limits  HIV ANTIBODY (ROUTINE TESTING W REFLEX)  RPR  POCT URINE PREGNANCY  CERVICOVAGINAL ANCILLARY ONLY    EKG   Radiology No results found.  Procedures Procedures (including critical care time)  Medications Ordered in UC Medications - No data to display  Initial Impression / Assessment and Plan / UC Course  I have reviewed the triage vital signs and the nursing notes.  Pertinent labs & imaging results that were available during my care of the patient were reviewed by me and considered in my medical decision making (see chart for details).    Screening for STDs without known exposure, cytology screening for chlamydia, gonorrhea, trichomonas, and vaginitis along with HIV and RPR pending Patient made aware their office will only contact her if any of her results are abnormal. Encourage safe sexual practices with barrier protection. Final Clinical Impressions(s) / UC Diagnoses   Final diagnoses:  Screen for STD (sexually transmitted  disease)   Discharge Instructions   None    ED Prescriptions   None    PDMP not reviewed this encounter.   Bing Neighbors, NP 04/27/23 [phone]

## 2023-04-28 LAB — HIV ANTIBODY (ROUTINE TESTING W REFLEX): HIV Screen 4th Generation wRfx: NONREACTIVE

## 2023-04-28 LAB — CERVICOVAGINAL ANCILLARY ONLY
Bacterial Vaginitis (gardnerella): NEGATIVE
Candida Glabrata: NEGATIVE
Candida Vaginitis: POSITIVE — AB
Chlamydia: NEGATIVE
Comment: NEGATIVE
Comment: NEGATIVE
Comment: NEGATIVE
Comment: NEGATIVE
Comment: NEGATIVE
Comment: NORMAL
Neisseria Gonorrhea: NEGATIVE
Trichomonas: NEGATIVE

## 2023-04-28 LAB — RPR: RPR Ser Ql: NONREACTIVE

## 2023-04-29 ENCOUNTER — Telehealth (HOSPITAL_COMMUNITY): Payer: Self-pay | Admitting: Emergency Medicine

## 2023-04-29 MED ORDER — FLUCONAZOLE 150 MG PO TABS: 150.0000 mg | ORAL_TABLET | ORAL | 0 refills | Status: DC

## 2023-06-04 ENCOUNTER — Ambulatory Visit
Admission: EM | Admit: 2023-06-04 | Discharge: 2023-06-04 | Disposition: A | Discharge status: Home / Self Care | Payer: Medicaid Other | Attending: Family Medicine | Admitting: Family Medicine

## 2023-06-04 DIAGNOSIS — Z711 Person with feared health complaint in whom no diagnosis is made: Secondary | ICD-10-CM | POA: Insufficient documentation

## 2023-06-04 LAB — POCT URINE PREGNANCY: Preg Test, Ur: NEGATIVE

## 2023-06-04 NOTE — ED Triage Notes (Signed)
Pt requested STD testing. No symptoms at this time.

## 2023-06-04 NOTE — Discharge Instructions (Signed)
STD results will be available within 24 to 48 hours.  Our office will contact you if you require treatment based on your test results.

## 2023-06-05 ENCOUNTER — Telehealth: Payer: Self-pay | Admitting: Family Medicine

## 2023-06-05 LAB — CERVICOVAGINAL ANCILLARY ONLY
Bacterial Vaginitis (gardnerella): POSITIVE — AB
Candida Glabrata: NEGATIVE
Candida Vaginitis: NEGATIVE
Chlamydia: NEGATIVE
Comment: NEGATIVE
Comment: NEGATIVE
Comment: NEGATIVE
Comment: NEGATIVE
Comment: NEGATIVE
Comment: NORMAL
Neisseria Gonorrhea: NEGATIVE
Trichomonas: NEGATIVE

## 2023-06-05 MED ORDER — METRONIDAZOLE 500 MG PO TABS
500.0000 mg | ORAL_TABLET | Freq: Two times a day (BID) | ORAL | 0 refills | Status: DC
Start: 2023-06-05 — End: 2023-10-04

## 2023-06-05 NOTE — Telephone Encounter (Signed)
Vaginal Cytology, positive for BV. Treating with metronidazole 500 mg twice daily.E-prescribed to pharmacy on file

## 2023-06-05 NOTE — ED Provider Notes (Signed)
[name]    CSN: [id number] Arrival date & time: 06/04/23  4098      History   Chief Complaint Chief Complaint  Patient presents with   Exposure to STD    HPI Sheila Brennan is a 17 y.o. female.   HPI Patient here today for routine STD screening.  Currently asymptomatic.  Would like HIV and RPR testing. Denies any concerns of pregnancy.Patient is currently sexually active and does not routinely use barrier protection. Recently STD tested one month ago and all results were negative. Past Medical History:  Diagnosis Date   Chlamydia    Hypertension    Trichomonas infection    UTI (urinary tract infection)     Patient Active Problem List   Diagnosis Date Noted   Hypertension in pregnancy, preeclampsia, severe, delivered 09/04/2022   Vaginal delivery 09/04/2022   Chronic hypertension with superimposed pre-eclampsia 09/03/2022   Excessive weight gain during pregnancy in second trimester 07/08/2022   Chronic hypertension affecting pregnancy 04/15/2022   Chlamydia trachomatis infection in pregnancy in first trimester 04/15/2022   High risk teen pregnancy 03/24/2022    Past Surgical History:  Procedure Laterality Date   NO PAST SURGERIES     TONSILLECTOMY      OB History     Gravida  1   Para  1   Term  0   Preterm  1   AB  0   Living  1      SAB  0   IAB  0   Ectopic  0   Multiple  0   Live Births  1            Home Medications    Prior to Admission medications   Medication Sig Start Date End Date Taking? Authorizing Provider  Blood Pressure Monitoring (BLOOD PRESSURE KIT) DEVI 1 kit by Does not apply route once a week. Patient not taking: Reported on 11/04/2022 03/24/22   Constant, Peggy, MD  enalapril (VASOTEC) 10 MG tablet Take 1 tablet (10 mg total) by mouth 2 (two) times daily. Patient not taking: Reported on 01/09/2023 11/04/22   Brock Bad, MD  fluconazole (DIFLUCAN) 150 MG tablet Take 1 tablet (150 mg total) by  mouth every 3 (three) days. Take 1 pill today.  May take second pill in 3 days if no resolution of symptoms with first pill. 04/29/23   Lamptey, Britta Mccreedy, MD  furosemide (LASIX) 20 MG tablet Take 1 tablet (20 mg total) by mouth 2 (two) times daily. Patient not taking: Reported on 11/04/2022 09/07/22   Hermina Staggers, MD  ibuprofen (ADVIL) 600 MG tablet Take 1 tablet (600 mg total) by mouth every 6 (six) hours. Patient not taking: Reported on 11/04/2022 09/07/22   Hermina Staggers, MD  metroNIDAZOLE (FLAGYL) 500 MG tablet Take 1 tablet (500 mg total) by mouth 2 (two) times daily. 06/05/23   Bing Neighbors, NP  Misc. Devices (GOJJI WEIGHT SCALE) MISC 1 Device by Does not apply route every 30 (thirty) days. Patient not taking: Reported on 11/04/2022 03/24/22   Constant, Peggy, MD  Prenat-Fe Poly-Methfol-FA-DHA (VITAFOL ULTRA) 29-0.6-0.4-200 MG CAPS Take 1 capsule by mouth daily. Patient not taking: Reported on 01/09/2023 04/15/22   Hurshel Party, CNM    Family History Family History  Problem Relation Age of Onset   Hypertension Mother    Diabetes Mother    Other Father        work accident   Asthma Neg  Hx    Cancer Neg Hx    Hearing loss Neg Hx    Stroke Neg Hx     Social History Social History   Tobacco Use   Smoking status: Never   Smokeless tobacco: Never  Vaping Use   Vaping status: Never Used  Substance Use Topics   Alcohol use: Not Currently    Comment: not since confirmed pregnancy   Drug use: Not Currently     Allergies   Patient has no known allergies.   Review of Systems Review of Systems Pertinent negatives listed in HPI  Physical Exam Triage Vital Signs ED Triage Vitals  Encounter Vitals Group     BP 06/04/23 2002 126/84     Systolic BP Percentile --      Diastolic BP Percentile --      Pulse Rate 06/04/23 2002 84     Resp 06/04/23 2002 18     Temp 06/04/23 2002 98.3 F (36.8 C)     Temp Source 06/04/23 2002 Oral     SpO2 06/04/23 2002 97 %      Weight --      Height --      Head Circumference --      Peak Flow --      Pain Score 06/04/23 2003 0     Pain Loc --      Pain Education --      Exclude from Growth Chart --    No data found.  Updated Vital Signs BP 126/84 (BP Location: Right Arm)   Pulse 84   Temp 98.3 F (36.8 C) (Oral)   Resp 18   LMP 05/14/2023 (Approximate)   SpO2 97%   Visual Acuity Right Eye Distance:   Left Eye Distance:   Bilateral Distance:    Right Eye Near:   Left Eye Near:    Bilateral Near:     Physical Exam Vitals reviewed.  Constitutional:      Appearance: Normal appearance.  HENT:     Head: Normocephalic and atraumatic.  Eyes:     Extraocular Movements: Extraocular movements intact.     Pupils: Pupils are equal, round, and reactive to light.  Cardiovascular:     Rate and Rhythm: Normal rate and regular rhythm.  Pulmonary:     Effort: Pulmonary effort is normal.     Breath sounds: Normal breath sounds.  Musculoskeletal:     Cervical back: Normal range of motion.  Neurological:     General: No focal deficit present.     Mental Status: She is alert.    UC Treatments / Results  Labs (all labs ordered are listed, but only abnormal results are displayed) Labs Reviewed  CERVICOVAGINAL ANCILLARY ONLY - Abnormal; Notable for the following components:      Result Value   Bacterial Vaginitis (gardnerella) Positive (*)    All other components within normal limits  POCT URINE PREGNANCY    EKG   Radiology No results found.  Procedures Procedures (including critical care time)  Medications Ordered in UC Medications - No data to display  Initial Impression / Assessment and Plan / UC Course  I have reviewed the triage vital signs and the nursing notes.  Pertinent labs & imaging results that were available during my care of the patient were reviewed by me and considered in my medical decision making (see chart for details).    STD testing, routine asymptomatic.   Patient advised that this will only contact her if her  results require treatment. Encourage safe sex practices. Final Clinical Impressions(s) / UC Diagnoses   Final diagnoses:  Concern about STD in female without diagnosis     Discharge Instructions      STD results will be available within 24 to 48 hours.  Our office will contact you if you require treatment based on your test results.    ED Prescriptions   None    PDMP not reviewed this encounter.   Bing Neighbors, NP 06/07/23 [phone]

## 2023-06-10 ENCOUNTER — Encounter: Payer: Self-pay | Admitting: Student

## 2023-06-14 IMAGING — US US OB LIMITED
1 series · 6 of 6 positions shown · non-contrast
Comparison: none

[Series 1: us ob limited · 6 of 6 slices shown]
[im 1/6]
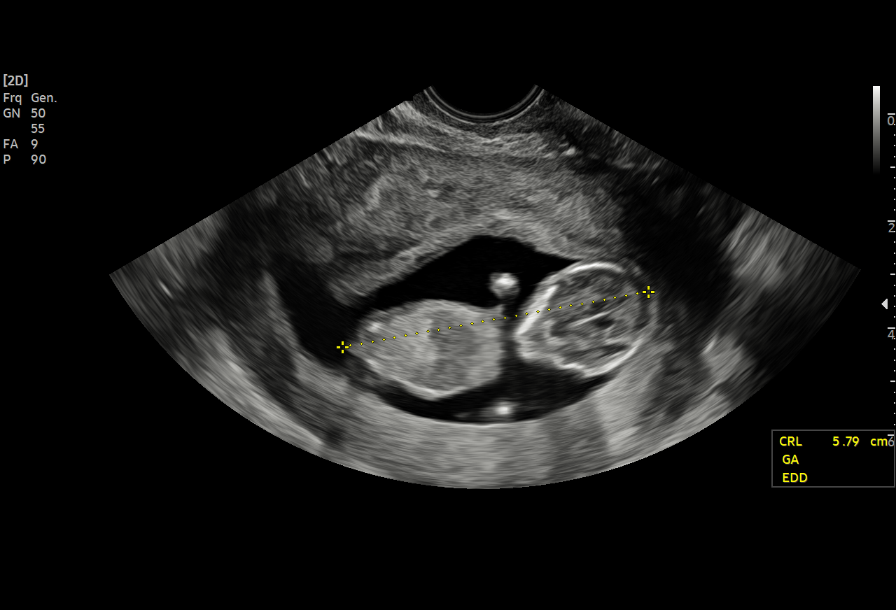
[im 2/6]
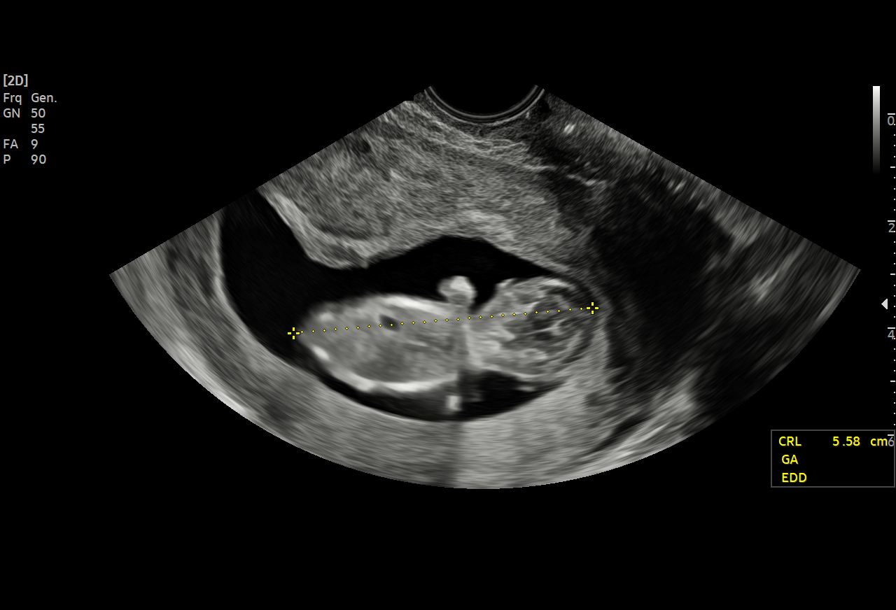
[im 3/6]
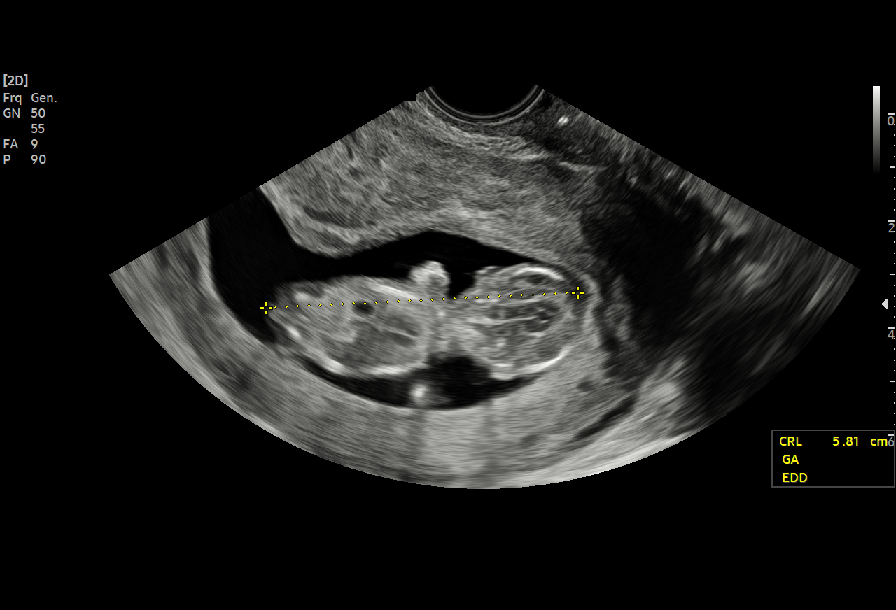
[im 4/6]
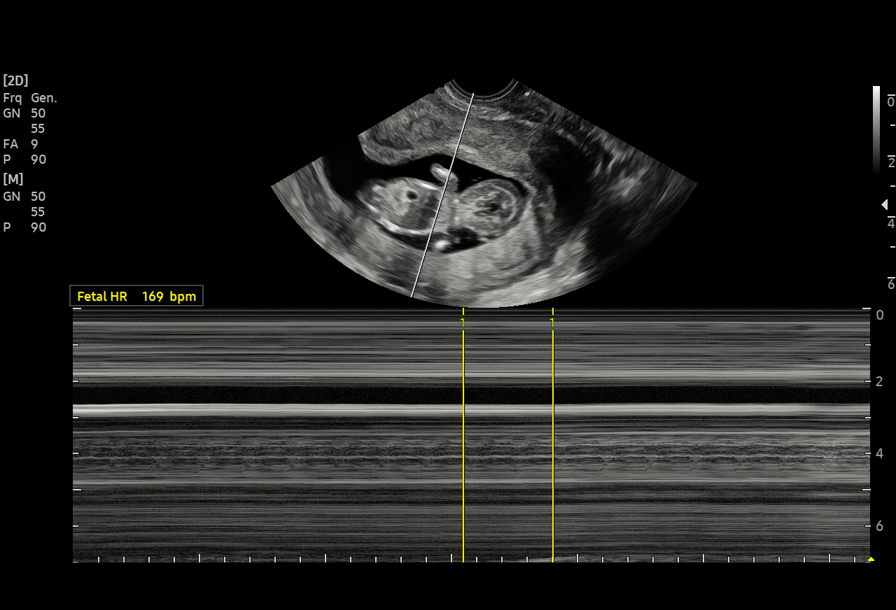
[im 5/6]
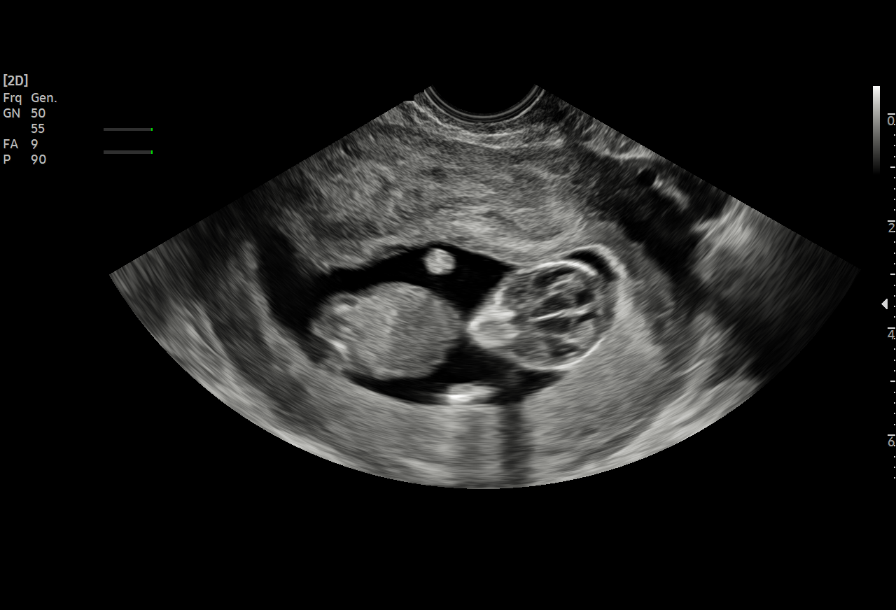
[im 6/6]
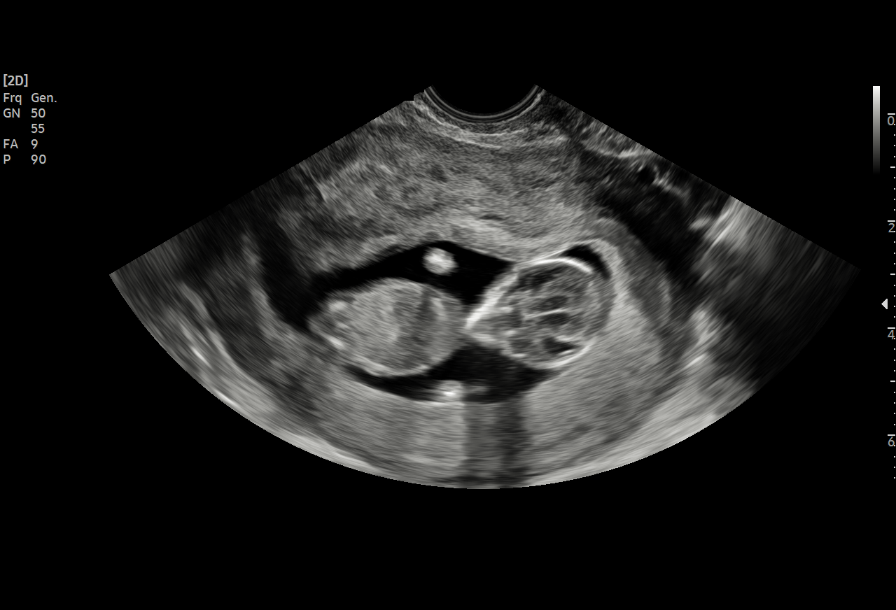

[6 of 6 positions shown; findings below may reference images not displayed]

[REDACTED]care

 1  [HOSPITAL]                         [address].0     [facility]

Indications

 12 weeks gestation of pregnancy
Fetal Evaluation

 Num Of Fetuses:         1
 Fetal Heart Rate(bpm):  169
 Cardiac Activity:       Observed
Biometry

 CRL:      57.3  mm     G. Age:  12w 1d                  EDD:   10/05/22
Gestational Age

 Best:          12w 1d     Det. By:  U/S C R L (03/24/22)     EDD:   10/05/22
Comments

 Single live IUP at 88w8d by CRL. LMP provided 12/29/21
Impression

 Viable intrauterine pregnancy
Recommendations

 Routine prenatal care
               Rache, Jhon Javer

## 2023-06-27 ENCOUNTER — Encounter: Payer: Self-pay | Admitting: Emergency Medicine

## 2023-06-27 ENCOUNTER — Ambulatory Visit
Admission: EM | Admit: 2023-06-27 | Discharge: 2023-06-27 | Disposition: A | Discharge status: Home / Self Care | Payer: Medicaid Other | Attending: Physician Assistant | Admitting: Physician Assistant

## 2023-06-27 DIAGNOSIS — Z113 Encounter for screening for infections with a predominantly sexual mode of transmission: Secondary | ICD-10-CM | POA: Diagnosis not present

## 2023-06-27 NOTE — ED Provider Notes (Signed)
[facility]    CSN: [id number] Arrival date & time: 06/27/23  1522      History   Chief Complaint Chief Complaint  Patient presents with   Follow-up    HPI Sheila Brennan is a 17 y.o. female.   Patient here today for evaluation of possible recurrent BV.  She states that she had noticed a vaginal odor earlier today.  She states that it was similar to when she was tested positive for BV previously.  She has not had any other vaginal discharge or other concerns.  She would like other STD screening as she has had a new sexual partner.  She denies any known STD exposure.  The history is provided by the patient.    Past Medical History:  Diagnosis Date   Chlamydia    Hypertension    Trichomonas infection    UTI (urinary tract infection)     Patient Active Problem List   Diagnosis Date Noted   Hypertension in pregnancy, preeclampsia, severe, delivered 09/04/2022   Vaginal delivery 09/04/2022   Chronic hypertension with superimposed pre-eclampsia 09/03/2022   Excessive weight gain during pregnancy in second trimester 07/08/2022   Chronic hypertension affecting pregnancy 04/15/2022   Chlamydia trachomatis infection in pregnancy in first trimester 04/15/2022   High risk teen pregnancy 03/24/2022    Past Surgical History:  Procedure Laterality Date   NO PAST SURGERIES     TONSILLECTOMY      OB History     Gravida  1   Para  1   Term  0   Preterm  1   AB  0   Living  1      SAB  0   IAB  0   Ectopic  0   Multiple  0   Live Births  1            Home Medications    Prior to Admission medications   Medication Sig Start Date End Date Taking? Authorizing Provider  Blood Pressure Monitoring (BLOOD PRESSURE KIT) DEVI 1 kit by Does not apply route once a week. Patient not taking: Reported on 11/04/2022 03/24/22   Constant, Peggy, MD  enalapril (VASOTEC) 10 MG tablet Take 1 tablet (10 mg total) by mouth 2 (two) times daily. Patient not  taking: Reported on 01/09/2023 11/04/22   Brock Bad, MD  fluconazole (DIFLUCAN) 150 MG tablet Take 1 tablet (150 mg total) by mouth every 3 (three) days. Take 1 pill today.  May take second pill in 3 days if no resolution of symptoms with first pill. 04/29/23   Lamptey, Britta Mccreedy, MD  furosemide (LASIX) 20 MG tablet Take 1 tablet (20 mg total) by mouth 2 (two) times daily. Patient not taking: Reported on 11/04/2022 09/07/22   Hermina Staggers, MD  ibuprofen (ADVIL) 600 MG tablet Take 1 tablet (600 mg total) by mouth every 6 (six) hours. Patient not taking: Reported on 11/04/2022 09/07/22   Hermina Staggers, MD  metroNIDAZOLE (FLAGYL) 500 MG tablet Take 1 tablet (500 mg total) by mouth 2 (two) times daily. 06/05/23   Bing Neighbors, NP  Misc. Devices (GOJJI WEIGHT SCALE) MISC 1 Device by Does not apply route every 30 (thirty) days. Patient not taking: Reported on 11/04/2022 03/24/22   Constant, Peggy, MD  Prenat-Fe Poly-Methfol-FA-DHA (VITAFOL ULTRA) 29-0.6-0.4-200 MG CAPS Take 1 capsule by mouth daily. Patient not taking: Reported on 01/09/2023 04/15/22   Leftwich-Kirby, Wilmer Floor, CNM    Family History  Family History  Problem Relation Age of Onset   Hypertension Mother    Diabetes Mother    Other Father        work accident   Asthma Neg Hx    Cancer Neg Hx    Hearing loss Neg Hx    Stroke Neg Hx     Social History Social History   Tobacco Use   Smoking status: Never   Smokeless tobacco: Never  Vaping Use   Vaping status: Never Used  Substance Use Topics   Alcohol use: Not Currently    Comment: not since confirmed pregnancy   Drug use: Not Currently     Allergies   Patient has no known allergies.   Review of Systems Review of Systems  Constitutional:  Negative for chills and fever.  Eyes:  Negative for discharge and redness.  Respiratory:  Negative for shortness of breath.   Gastrointestinal:  Negative for abdominal pain, nausea and vomiting.  Genitourinary:   Negative for genital sores and vaginal discharge.  Musculoskeletal:  Negative for back pain.     Physical Exam Triage Vital Signs ED Triage Vitals  Encounter Vitals Group     BP      Systolic BP Percentile      Diastolic BP Percentile      Pulse      Resp      Temp      Temp src      SpO2      Weight      Height      Head Circumference      Peak Flow      Pain Score      Pain Loc      Pain Education      Exclude from Growth Chart    No data found.  Updated Vital Signs BP 121/83 (BP Location: Left Arm)   Pulse 87   Temp 98.5 F (36.9 C) (Oral)   Resp 17   LMP 05/14/2023 (Approximate)   SpO2 97%      Physical Exam Vitals and nursing note reviewed.  Constitutional:      General: She is not in acute distress.    Appearance: Normal appearance. She is not ill-appearing.  HENT:     Head: Normocephalic and atraumatic.  Eyes:     Conjunctiva/sclera: Conjunctivae normal.  Cardiovascular:     Rate and Rhythm: Normal rate.  Pulmonary:     Effort: Pulmonary effort is normal. No respiratory distress.  Neurological:     Mental Status: She is alert.  Psychiatric:        Mood and Affect: Mood normal.        Behavior: Behavior normal.        Thought Content: Thought content normal.      UC Treatments / Results  Labs (all labs ordered are listed, but only abnormal results are displayed) Labs Reviewed  CERVICOVAGINAL ANCILLARY ONLY    EKG   Radiology No results found.  Procedures Procedures (including critical care time)  Medications Ordered in UC Medications - No data to display  Initial Impression / Assessment and Plan / UC Course  I have reviewed the triage vital signs and the nursing notes.  Pertinent labs & imaging results that were available during my care of the patient were reviewed by me and considered in my medical decision making (see chart for details).    STD screening ordered.  Will await results further recommendation.  Encouraged  follow-up with  any further concerns.  Final Clinical Impressions(s) / UC Diagnoses   Final diagnoses:  Screening for STD (sexually transmitted disease)   Discharge Instructions   None    ED Prescriptions   None    PDMP not reviewed this encounter.   Tomi Bamberger, PA-C 06/27/23 1547

## 2023-06-27 NOTE — ED Triage Notes (Signed)
Treated for BV recently, finished prescription, believes it didn't fully go away

## 2023-06-29 LAB — CERVICOVAGINAL ANCILLARY ONLY
Bacterial Vaginitis (gardnerella): NEGATIVE
Candida Glabrata: NEGATIVE
Candida Vaginitis: NEGATIVE
Chlamydia: NEGATIVE
Comment: NEGATIVE
Comment: NEGATIVE
Comment: NEGATIVE
Comment: NEGATIVE
Comment: NEGATIVE
Comment: NORMAL
Neisseria Gonorrhea: NEGATIVE
Trichomonas: NEGATIVE

## 2023-07-24 ENCOUNTER — Encounter: Payer: Self-pay | Admitting: Physician Assistant

## 2023-07-24 ENCOUNTER — Ambulatory Visit
Admission: EM | Admit: 2023-07-24 | Discharge: 2023-07-24 | Disposition: A | Discharge status: Home / Self Care | Payer: Medicaid Other | Attending: Physician Assistant | Admitting: Physician Assistant

## 2023-07-24 DIAGNOSIS — A6000 Herpesviral infection of urogenital system, unspecified: Secondary | ICD-10-CM

## 2023-07-24 MED ORDER — VALACYCLOVIR HCL 1 G PO TABS: 1000.0000 mg | ORAL_TABLET | Freq: Two times a day (BID) | ORAL | 0 refills | Status: AC

## 2023-07-24 NOTE — ED Triage Notes (Signed)
Patient presents to [facility] for STD screening. States she believes she has herpes. First outbreak in June, unsure if she has active lesion at today's visit. States she is here for herpes medication. Declines cyto swab.

## 2023-07-24 NOTE — ED Provider Notes (Signed)
[facility]    CSN: [id number] Arrival date & time: 07/24/23  0800      History   Chief Complaint Chief Complaint  Patient presents with   SEXUALLY TRANSMITTED DISEASE    HPI Sheila Brennan is a 17 y.o. female.   Patient here today for evaluation of possible herpes outbreak.  She states that she has had herpes in the past and states that she feels as if her symptoms are starting to clear however she would like antiviral medication to help complete clearance.  She denies any other concerns for other STDs and denies any other symptoms.  She declines any further STD screening today.  The history is provided by the patient.    Past Medical History:  Diagnosis Date   Chlamydia    Hypertension    Trichomonas infection    UTI (urinary tract infection)     Patient Active Problem List   Diagnosis Date Noted   Hypertension in pregnancy, preeclampsia, severe, delivered 09/04/2022   Vaginal delivery 09/04/2022   Chronic hypertension with superimposed pre-eclampsia 09/03/2022   Excessive weight gain during pregnancy in second trimester 07/08/2022   Chronic hypertension affecting pregnancy 04/15/2022   Chlamydia trachomatis infection in pregnancy in first trimester 04/15/2022   High risk teen pregnancy 03/24/2022    Past Surgical History:  Procedure Laterality Date   NO PAST SURGERIES     TONSILLECTOMY      OB History     Gravida  1   Para  1   Term  0   Preterm  1   AB  0   Living  1      SAB  0   IAB  0   Ectopic  0   Multiple  0   Live Births  1            Home Medications    Prior to Admission medications   Medication Sig Start Date End Date Taking? Authorizing Provider  valACYclovir (VALTREX) 1000 MG tablet Take 1 tablet (1,000 mg total) by mouth 2 (two) times daily for 14 days. 07/24/23 08/07/23 Yes Tomi Bamberger, PA-C  Blood Pressure Monitoring (BLOOD PRESSURE KIT) DEVI 1 kit by Does not apply route once a week. Patient not  taking: Reported on 11/04/2022 03/24/22   Constant, Peggy, MD  enalapril (VASOTEC) 10 MG tablet Take 1 tablet (10 mg total) by mouth 2 (two) times daily. Patient not taking: Reported on 01/09/2023 11/04/22   Brock Bad, MD  fluconazole (DIFLUCAN) 150 MG tablet Take 1 tablet (150 mg total) by mouth every 3 (three) days. Take 1 pill today.  May take second pill in 3 days if no resolution of symptoms with first pill. 04/29/23   Lamptey, Britta Mccreedy, MD  furosemide (LASIX) 20 MG tablet Take 1 tablet (20 mg total) by mouth 2 (two) times daily. Patient not taking: Reported on 11/04/2022 09/07/22   Hermina Staggers, MD  ibuprofen (ADVIL) 600 MG tablet Take 1 tablet (600 mg total) by mouth every 6 (six) hours. Patient not taking: Reported on 11/04/2022 09/07/22   Hermina Staggers, MD  metroNIDAZOLE (FLAGYL) 500 MG tablet Take 1 tablet (500 mg total) by mouth 2 (two) times daily. 06/05/23   Bing Neighbors, NP  Misc. Devices (GOJJI WEIGHT SCALE) MISC 1 Device by Does not apply route every 30 (thirty) days. Patient not taking: Reported on 11/04/2022 03/24/22   Constant, Peggy, MD  Prenat-Fe Poly-Methfol-FA-DHA (VITAFOL ULTRA) 29-0.6-0.4-200 MG CAPS  Take 1 capsule by mouth daily. Patient not taking: Reported on 01/09/2023 04/15/22   Leftwich-Kirby, Wilmer Floor, CNM    Family History Family History  Problem Relation Age of Onset   Hypertension Mother    Diabetes Mother    Other Father        work accident   Asthma Neg Hx    Cancer Neg Hx    Hearing loss Neg Hx    Stroke Neg Hx     Social History Social History   Tobacco Use   Smoking status: Never   Smokeless tobacco: Never  Vaping Use   Vaping status: Never Used  Substance Use Topics   Alcohol use: Not Currently    Comment: not since confirmed pregnancy   Drug use: Not Currently     Allergies   Patient has no known allergies.   Review of Systems Review of Systems  Constitutional:  Negative for chills and fever.  Eyes:  Negative for  discharge and redness.  Gastrointestinal:  Negative for abdominal pain, nausea and vomiting.  Genitourinary:  Positive for genital sores. Negative for vaginal discharge.     Physical Exam Triage Vital Signs ED Triage Vitals  Encounter Vitals Group     BP      Systolic BP Percentile      Diastolic BP Percentile      Pulse      Resp      Temp      Temp src      SpO2      Weight      Height      Head Circumference      Peak Flow      Pain Score      Pain Loc      Pain Education      Exclude from Growth Chart    No data found.  Updated Vital Signs BP (!) 140/95 (BP Location: Left Arm)   Pulse 73   Temp 98.7 F (37.1 C) (Oral)   Resp 16   LMP 07/20/2023 (Approximate)   SpO2 100%      Physical Exam Vitals and nursing note reviewed.  Constitutional:      General: She is not in acute distress.    Appearance: Normal appearance. She is not ill-appearing.  HENT:     Head: Normocephalic and atraumatic.  Eyes:     Conjunctiva/sclera: Conjunctivae normal.  Cardiovascular:     Rate and Rhythm: Normal rate.  Pulmonary:     Effort: Pulmonary effort is normal. No respiratory distress.  Neurological:     Mental Status: She is alert.  Psychiatric:        Mood and Affect: Mood normal.        Behavior: Behavior normal.        Thought Content: Thought content normal.      UC Treatments / Results  Labs (all labs ordered are listed, but only abnormal results are displayed) Labs Reviewed - No data to display  EKG   Radiology No results found.  Procedures Procedures (including critical care time)  Medications Ordered in UC Medications - No data to display  Initial Impression / Assessment and Plan / UC Course  I have reviewed the triage vital signs and the nursing notes.  Pertinent labs & imaging results that were available during my care of the patient were reviewed by me and considered in my medical decision making (see chart for details).  Valtrex prescribed  and recommended further evaluation  should symptoms fail to clear with any further concerns.  Final Clinical Impressions(s) / UC Diagnoses   Final diagnoses:  Genital herpes simplex, unspecified site   Discharge Instructions   None    ED Prescriptions     Medication Sig Dispense Auth. Provider   valACYclovir (VALTREX) 1000 MG tablet Take 1 tablet (1,000 mg total) by mouth 2 (two) times daily for 14 days. 28 tablet Tomi Bamberger, PA-C      PDMP not reviewed this encounter.   Tomi Bamberger, PA-C 07/24/23 [phone]

## 2023-08-14 ENCOUNTER — Ambulatory Visit: Payer: Medicaid Other | Admitting: Nurse Practitioner

## 2023-10-04 ENCOUNTER — Telehealth: Payer: Self-pay

## 2023-10-04 ENCOUNTER — Ambulatory Visit
Admission: EM | Admit: 2023-10-04 | Discharge: 2023-10-04 | Disposition: A | Discharge status: Home / Self Care | Payer: Medicaid Other | Attending: Internal Medicine | Admitting: Internal Medicine

## 2023-10-04 DIAGNOSIS — A6000 Herpesviral infection of urogenital system, unspecified: Secondary | ICD-10-CM | POA: Insufficient documentation

## 2023-10-04 DIAGNOSIS — R350 Frequency of micturition: Secondary | ICD-10-CM | POA: Diagnosis not present

## 2023-10-04 DIAGNOSIS — Z113 Encounter for screening for infections with a predominantly sexual mode of transmission: Secondary | ICD-10-CM | POA: Diagnosis not present

## 2023-10-04 LAB — POCT URINE PREGNANCY: Preg Test, Ur: NEGATIVE

## 2023-10-04 MED ORDER — VALACYCLOVIR HCL 1 G PO TABS: ORAL_TABLET | ORAL | 5 refills | Status: DC

## 2023-10-04 MED ORDER — VALACYCLOVIR HCL 1 G PO TABS: ORAL_TABLET | ORAL | 5 refills | Status: AC

## 2023-10-04 NOTE — Discharge Instructions (Addendum)
We will have your test results on Tuesday. We will message you through mychart or call you. If you have not heard from [place] by Wednesday and see test results in your mychart that need review, then call our clinic directly.   You have received refills of valacyclovir. You have additional refills that you can request for up to a year. It would be most effective for you to get one last refill October 2025.

## 2023-10-04 NOTE — ED Triage Notes (Signed)
Pt requested STD's test and pregnancy test.

## 2023-10-04 NOTE — ED Provider Notes (Addendum)
[facility]  Note:  This document was prepared using Conservation officer, historic buildings and may include unintentional dictation errors.  MRN: [id number] DOB: [birth year]  Subjective:   Sheila Brennan is a 17 y.o. female presenting for 2 day history of vaginal discharge.  She is sexually active, has 1 female partner. Does not use condoms consistently. Has a history of STI. Denies fever, n/v, abdominal pain, pelvic pain, rashes, dysuria, urinary frequency, hematuria.  Has had an occasional BV and yeast infection. LMP was 2 weeks ago, was regular. No birth control.   No current facility-administered medications for this encounter. No current outpatient medications on file.   No Known Allergies  Past Medical History:  Diagnosis Date   Chlamydia    Hypertension    Trichomonas infection    UTI (urinary tract infection)      Past Surgical History:  Procedure Laterality Date   NO PAST SURGERIES     TONSILLECTOMY      Family History  Problem Relation Age of Onset   Hypertension Mother    Diabetes Mother    Other Father        work accident   Asthma Neg Hx    Cancer Neg Hx    Hearing loss Neg Hx    Stroke Neg Hx     Social History   Tobacco Use   Smoking status: Never   Smokeless tobacco: Never  Vaping Use   Vaping status: Never Used  Substance Use Topics   Alcohol use: Not Currently    Comment: not since confirmed pregnancy   Drug use: Not Currently    ROS   Objective:   Vitals: BP (!) 162/112 (BP Location: Left Arm)   Pulse 88   Temp 98.7 F (37.1 C) (Oral)   Resp 16   LMP  (Within Weeks) Comment: 2 weeks  SpO2 98%   Breastfeeding No   Physical Exam Constitutional:      General: She is not in acute distress.    Appearance: Normal appearance. She is well-developed. She is not ill-appearing, toxic-appearing or diaphoretic.  HENT:     Head: Normocephalic and atraumatic.     Nose: Nose normal.     Mouth/Throat:     Mouth: Mucous  membranes are moist.  Eyes:     General: No scleral icterus.       Right eye: No discharge.        Left eye: No discharge.     Extraocular Movements: Extraocular movements intact.     Conjunctiva/sclera: Conjunctivae normal.  Cardiovascular:     Rate and Rhythm: Normal rate.  Pulmonary:     Effort: Pulmonary effort is normal.  Abdominal:     General: Bowel sounds are normal. There is no distension.     Palpations: Abdomen is soft. There is no mass.     Tenderness: There is no abdominal tenderness. There is no right CVA tenderness, left CVA tenderness, guarding or rebound.  Skin:    General: Skin is warm and dry.  Neurological:     General: No focal deficit present.     Mental Status: She is alert and oriented to person, place, and time.  Psychiatric:        Mood and Affect: Mood normal.        Behavior: Behavior normal.        Thought Content: Thought content normal.        Judgment: Judgment normal.    Results for  orders placed or performed during the hospital encounter of 10/04/23 (from the past 24 hour(s))  POCT urine pregnancy     Status: None   Collection Time: 10/04/23  2:45 PM  Result Value Ref Range   Preg Test, Ur Negative Negative    Assessment and Plan :   PDMP not reviewed this encounter.  1. Screen for STD (sexually transmitted disease)    Patient declined empiric treatment. Will treat as appropriate based off of lab results.   Initially, patient declined urinary symptoms.  But at discharge, she expressed that she wanted to check for UTI.  Then she endorsed that she was having pelvic pain, urinary frequency and urgency.  As such, I recommended basing treatment off of an urine culture.  At discharge, patient also requested medication refill for valacyclovir.  This is for genital HSV.  Counseled patient on potential for adverse effects with medications prescribed/recommended today, ER and return-to-clinic precautions discussed, patient verbalized  understanding.      Wallis Bamberg, [place] 10/04/23 [id number]

## 2023-10-05 ENCOUNTER — Telehealth (HOSPITAL_BASED_OUTPATIENT_CLINIC_OR_DEPARTMENT_OTHER): Payer: Self-pay

## 2023-10-05 LAB — CERVICOVAGINAL ANCILLARY ONLY
Bacterial Vaginitis (gardnerella): POSITIVE — AB
Candida Glabrata: NEGATIVE
Candida Vaginitis: POSITIVE — AB
Chlamydia: NEGATIVE
Comment: NEGATIVE
Comment: NEGATIVE
Comment: NEGATIVE
Comment: NEGATIVE
Comment: NEGATIVE
Comment: NORMAL
Neisseria Gonorrhea: NEGATIVE
Trichomonas: NEGATIVE

## 2023-10-05 LAB — URINE CULTURE: Culture: <10000 — AB

## 2023-10-05 MED ORDER — FLUCONAZOLE 150 MG PO TABS: 150.0000 mg | ORAL_TABLET | Freq: Once | ORAL | 0 refills | Status: AC

## 2023-10-05 MED ORDER — METRONIDAZOLE 500 MG PO TABS: 500.0000 mg | ORAL_TABLET | Freq: Two times a day (BID) | ORAL | 0 refills | Status: AC

## 2023-10-05 NOTE — Telephone Encounter (Signed)
Per protocol, pt requires tx with metronidazole and Diflucan. Attempted to reach patient x1. LVM. Rx sent to pharmacy on file.

## 2023-10-06 LAB — RPR: RPR Ser Ql: NONREACTIVE

## 2023-10-06 LAB — HIV ANTIBODY (ROUTINE TESTING W REFLEX): HIV Screen 4th Generation wRfx: NONREACTIVE

## 2023-10-09 ENCOUNTER — Ambulatory Visit: Payer: Medicaid Other | Admitting: Nurse Practitioner

## 2023-12-03 ENCOUNTER — Ambulatory Visit: Payer: Medicaid Other | Admitting: Nurse Practitioner

## 2024-03-17 ENCOUNTER — Ambulatory Visit
Admission: EM | Admit: 2024-03-17 | Discharge: 2024-03-17 | Disposition: A | Discharge status: Home / Self Care | Attending: Family Medicine | Admitting: Family Medicine

## 2024-03-17 ENCOUNTER — Encounter: Payer: Self-pay | Admitting: Emergency Medicine

## 2024-03-17 ENCOUNTER — Other Ambulatory Visit: Payer: Self-pay

## 2024-03-17 DIAGNOSIS — Z202 Contact with and (suspected) exposure to infections with a predominantly sexual mode of transmission: Secondary | ICD-10-CM | POA: Diagnosis not present

## 2024-03-17 DIAGNOSIS — N898 Other specified noninflammatory disorders of vagina: Secondary | ICD-10-CM | POA: Diagnosis present

## 2024-03-17 DIAGNOSIS — R350 Frequency of micturition: Secondary | ICD-10-CM | POA: Diagnosis not present

## 2024-03-17 LAB — POCT URINALYSIS DIP (MANUAL ENTRY)
Bilirubin, UA: NEGATIVE
Blood, UA: NEGATIVE
Glucose, UA: NEGATIVE mg/dL
Ketones, POC UA: NEGATIVE mg/dL
Nitrite, UA: NEGATIVE
Protein Ur, POC: NEGATIVE mg/dL
Spec Grav, UA: 1.01 (ref 1.010–1.025)
Urobilinogen, UA: 0.2 U/dL
pH, UA: 6 (ref 5.0–8.0)

## 2024-03-17 NOTE — ED Provider Notes (Signed)
 [facility]    CSN: [id number] Arrival date & time: 03/17/24  0911      History   Chief Complaint Chief Complaint  Patient presents with   Exposure to STD    HPI Sheila Brennan is a 18 y.o. female.    Exposure to STD  Here for STD testing.  She has not had any fever or pelvic pain or dysuria.  She has had some urinary frequency and also some vaginal odor that is fishy.  Last menstrual cycle was April 1.  NKDA     Past Medical History:  Diagnosis Date   Chlamydia    Hypertension    Trichomonas infection    UTI (urinary tract infection)     Patient Active Problem List   Diagnosis Date Noted   Hypertension in pregnancy, preeclampsia, severe, delivered 09/04/2022   Vaginal delivery 09/04/2022   Chronic hypertension with superimposed pre-eclampsia 09/03/2022   Excessive weight gain during pregnancy in second trimester 07/08/2022   Chronic hypertension affecting pregnancy 04/15/2022   Chlamydia trachomatis infection in pregnancy in first trimester 04/15/2022   High risk teen pregnancy 03/24/2022    Past Surgical History:  Procedure Laterality Date   NO PAST SURGERIES     TONSILLECTOMY      OB History     Gravida  1   Para  1   Term  0   Preterm  1   AB  0   Living  1      SAB  0   IAB  0   Ectopic  0   Multiple  0   Live Births  1            Home Medications    Prior to Admission medications   Medication Sig Start Date End Date Taking? Authorizing Provider  valACYclovir  (VALTREX ) 1000 MG tablet At the start of an outbreak take 1 tablet daily for 5 days. 10/04/23   Adolph Hoop, PA-C    Family History Family History  Problem Relation Age of Onset   Hypertension Mother    Diabetes Mother    Other Father        work accident   Asthma Neg Hx    Cancer Neg Hx    Hearing loss Neg Hx    Stroke Neg Hx     Social History Social History   Tobacco Use   Smoking status: Never   Smokeless tobacco: Never  Vaping Use    Vaping status: Never Used  Substance Use Topics   Alcohol use: Not Currently    Comment: not since confirmed pregnancy   Drug use: Not Currently     Allergies   Patient has no known allergies.   Review of Systems Review of Systems   Physical Exam Triage Vital Signs ED Triage Vitals  Encounter Vitals Group     BP 03/17/24 0923 135/80     Systolic BP Percentile --      Diastolic BP Percentile --      Pulse --      Resp 03/17/24 0922 16     Temp 03/17/24 0922 98 F (36.7 C)     Temp src --      SpO2 03/17/24 0922 98 %     Weight --      Height --      Head Circumference --      Peak Flow --      Pain Score 03/17/24 0923 0  Pain Loc --      Pain Education --      Exclude from Growth Chart --    No data found.  Updated Vital Signs BP 135/80 (BP Location: Right Arm)   Temp 98 F (36.7 C)   Resp 16   LMP 02/23/2024 (Approximate)   SpO2 98%   Visual Acuity Right Eye Distance:   Left Eye Distance:   Bilateral Distance:    Right Eye Near:   Left Eye Near:    Bilateral Near:     Physical Exam Vitals reviewed.  Skin:    Coloration: Skin is not pale.  Neurological:     Mental Status: She is oriented to person, place, and time.  Psychiatric:        Behavior: Behavior normal.      UC Treatments / Results  Labs (all labs ordered are listed, but only abnormal results are displayed) Labs Reviewed  POCT URINALYSIS DIP (MANUAL ENTRY) - Abnormal; Notable for the following components:      Result Value   Color, UA light yellow (*)    Leukocytes, UA Trace (*)    All other components within normal limits  HIV ANTIBODY (ROUTINE TESTING W REFLEX)  RPR  CERVICOVAGINAL ANCILLARY ONLY    EKG   Radiology No results found.  Procedures Procedures (including critical care time)  Medications Ordered in UC Medications - No data to display  Initial Impression / Assessment and Plan / UC Course  I have reviewed the triage vital signs and the nursing  notes.  Pertinent labs & imaging results that were available during my care of the patient were reviewed by me and considered in my medical decision making (see chart for details).      Urinalysis is negative except for a trace of white blood cells. Blood is drawn to check HIV and RPR. Vaginal self swab is done, and we will notify of any positives on that or on the blood, and treat per protocol.  She wishes to wait on empiric treatment for BV.   Final Clinical Impressions(s) / UC Diagnoses   Final diagnoses:  Urinary frequency  Vaginal odor  Exposure to STD     Discharge Instructions      The urinalysis did not show signs of infection.  Staff will notify you if there is anything positive on the swab or blood work.      ED Prescriptions   None    PDMP not reviewed this encounter.   Ann Keto, MD 03/17/24 [phone]

## 2024-03-17 NOTE — ED Triage Notes (Signed)
Pt requesting to be check for std ? ?

## 2024-03-17 NOTE — Discharge Instructions (Signed)
 The urinalysis did not show signs of infection.  Staff will notify you if there is anything positive on the swab or blood work.

## 2024-03-18 LAB — CERVICOVAGINAL ANCILLARY ONLY
Bacterial Vaginitis (gardnerella): NEGATIVE
Candida Glabrata: NEGATIVE
Candida Vaginitis: NEGATIVE
Chlamydia: NEGATIVE
Comment: NEGATIVE
Comment: NEGATIVE
Comment: NEGATIVE
Comment: NEGATIVE
Comment: NEGATIVE
Comment: NORMAL
Neisseria Gonorrhea: NEGATIVE
Trichomonas: NEGATIVE

## 2024-03-19 LAB — HIV ANTIBODY (ROUTINE TESTING W REFLEX): HIV Screen 4th Generation wRfx: NONREACTIVE

## 2024-03-19 LAB — RPR: RPR Ser Ql: NONREACTIVE

## 2024-09-07 ENCOUNTER — Ambulatory Visit
Admission: EM | Admit: 2024-09-07 | Discharge: 2024-09-07 | Disposition: A | Discharge status: Home / Self Care | Attending: Family Medicine | Admitting: Family Medicine

## 2024-09-07 DIAGNOSIS — N760 Acute vaginitis: Secondary | ICD-10-CM | POA: Diagnosis present

## 2024-09-07 DIAGNOSIS — Z113 Encounter for screening for infections with a predominantly sexual mode of transmission: Secondary | ICD-10-CM | POA: Insufficient documentation

## 2024-09-07 DIAGNOSIS — R350 Frequency of micturition: Secondary | ICD-10-CM | POA: Insufficient documentation

## 2024-09-07 LAB — POCT URINE DIPSTICK
Bilirubin, UA: NEGATIVE
Blood, UA: NEGATIVE
Glucose, UA: NEGATIVE mg/dL
Ketones, POC UA: NEGATIVE mg/dL
Leukocytes, UA: NEGATIVE
Nitrite, UA: NEGATIVE
POC PROTEIN,UA: NEGATIVE
Spec Grav, UA: <=1.005 — AB (ref 1.010–1.025)
Urobilinogen, UA: 0.2 U/dL
pH, UA: 6 (ref 5.0–8.0)

## 2024-09-07 LAB — POCT URINE PREGNANCY: Preg Test, Ur: NEGATIVE

## 2024-09-07 MED ORDER — FLUCONAZOLE 150 MG PO TABS: 150.0000 mg | ORAL_TABLET | ORAL | 0 refills | Status: DC

## 2024-09-07 NOTE — ED Provider Notes (Signed)
 [facility]  Note:  This document was prepared using Conservation officer, historic buildings and may include unintentional dictation errors.  MRN: [id number] DOB: 2006/08/06  Subjective:   Sheila Brennan is a 18 y.o. female presenting for 4 day history of vaginal discharge, vaginal irritation and pain. Denies fever, n/v, abdominal pain, pelvic pain, rashes, dysuria, hematuria.  Has a history of BV and yeast infection. Hydrates with water pretty well. Primarily drinks water.  Does not use protection, is not on birth control.  No current facility-administered medications for this encounter.  Current Outpatient Medications:    valACYclovir  (VALTREX ) 1000 MG tablet, At the start of an outbreak take 1 tablet daily for 5 days., Disp: 30 tablet, Rfl: 5   No Known Allergies  Past Medical History:  Diagnosis Date   Chlamydia    Hypertension    Trichomonas infection    UTI (urinary tract infection)      Past Surgical History:  Procedure Laterality Date   NO PAST SURGERIES     TONSILLECTOMY      Family History  Problem Relation Age of Onset   Hypertension Mother    Diabetes Mother    Other Father        work accident   Asthma Neg Hx    Cancer Neg Hx    Hearing loss Neg Hx    Stroke Neg Hx     Social History   Tobacco Use   Smoking status: Never   Smokeless tobacco: Never  Vaping Use   Vaping status: Never Used  Substance Use Topics   Alcohol use: Not Currently    Comment: not since confirmed pregnancy   Drug use: Not Currently    ROS   Objective:   Vitals: BP (!) 143/94   Pulse 81   Temp 99.4 F (37.4 C)   Resp 16   LMP 08/13/2024 (Approximate)   SpO2 98%   Physical Exam Constitutional:      General: She is not in acute distress.    Appearance: Normal appearance. She is well-developed. She is not ill-appearing, toxic-appearing or diaphoretic.  HENT:     Head: Normocephalic and atraumatic.     Nose: Nose normal.     Mouth/Throat:      Mouth: Mucous membranes are moist.  Eyes:     General: No scleral icterus.       Right eye: No discharge.        Left eye: No discharge.     Extraocular Movements: Extraocular movements intact.     Conjunctiva/sclera: Conjunctivae normal.  Cardiovascular:     Rate and Rhythm: Normal rate.  Pulmonary:     Effort: Pulmonary effort is normal.  Abdominal:     General: Bowel sounds are normal. There is no distension.     Palpations: Abdomen is soft. There is no mass.     Tenderness: There is no abdominal tenderness. There is no right CVA tenderness, left CVA tenderness, guarding or rebound.  Skin:    General: Skin is warm and dry.  Neurological:     General: No focal deficit present.     Mental Status: She is alert and oriented to person, place, and time.  Psychiatric:        Mood and Affect: Mood normal.        Behavior: Behavior normal.        Thought Content: Thought content normal.        Judgment: Judgment normal.  Results for orders placed or performed during the hospital encounter of 09/07/24 (from the past 24 hours)  POCT urine pregnancy     Status: None   Collection Time: 09/07/24  7:33 PM  Result Value Ref Range   Preg Test, Ur Negative Negative  POCT URINE DIPSTICK     Status: Abnormal   Collection Time: 09/07/24  7:33 PM  Result Value Ref Range   Color, UA yellow yellow   Clarity, UA clear clear   Glucose, UA negative negative mg/dL   Bilirubin, UA negative negative   Ketones, POC UA negative negative mg/dL   Spec Grav, UA <=8.994 (A) 1.010 - 1.025   Blood, UA negative negative   pH, UA 6.0 5.0 - 8.0   POC PROTEIN,UA negative negative, trace   Urobilinogen, UA 0.2 0.2 or 1.0 E.U./dL   Nitrite, UA Negative Negative   Leukocytes, UA Negative Negative    Assessment and Plan :   PDMP not reviewed this encounter.  1. Acute vaginitis   2. Screen for STD (sexually transmitted disease)   3. Urinary frequency    Declines HIV and syphilis testing.  We will  treat patient empirically for yeast vaginitis with fluconazole .  Labs pending. Counseled patient on potential for adverse effects with medications prescribed/recommended today, ER and return-to-clinic precautions discussed, patient verbalized understanding.    Christopher Savannah, [place] 09/07/24 [phone]

## 2024-09-07 NOTE — ED Triage Notes (Signed)
 Pt present for STD testing. Pt states she has vaginal irritation. Pt states she is concerned of yeast infection.

## 2024-09-07 NOTE — Discharge Instructions (Signed)
 Please start fluconazole  to address a suspected yeast infection. Make sure you hydrate very well with plain water and a quantity of 80 ounces of water a day.  Please limit drinks that are considered urinary irritants such as fruit juices, soda, sweet tea, coffee, artifical sweetened drinks, energy drinks, alcohol.  These can worsen your urinary and genital symptoms but also be the source of them.  I will let you know about your vaginal swab and urine culture results through MyChart to see if we need to prescribe or change your antibiotics based off of those results.

## 2024-09-08 LAB — URINE CULTURE: Culture: NO GROWTH

## 2024-09-08 LAB — CERVICOVAGINAL ANCILLARY ONLY
Bacterial Vaginitis (gardnerella): POSITIVE — AB
Candida Glabrata: NEGATIVE
Candida Vaginitis: POSITIVE — AB
Chlamydia: NEGATIVE
Comment: NEGATIVE
Comment: NEGATIVE
Comment: NEGATIVE
Comment: NEGATIVE
Comment: NEGATIVE
Comment: NORMAL
Neisseria Gonorrhea: NEGATIVE
Trichomonas: NEGATIVE

## 2024-09-09 ENCOUNTER — Ambulatory Visit (HOSPITAL_COMMUNITY): Payer: Self-pay

## 2024-09-09 MED ORDER — METRONIDAZOLE 500 MG PO TABS: 500.0000 mg | ORAL_TABLET | Freq: Two times a day (BID) | ORAL | 0 refills | Status: DC

## 2024-09-11 LAB — MISC LABCORP TEST (SEND OUT): Labcorp test code: 180076

## 2024-09-27 ENCOUNTER — Ambulatory Visit
Admission: EM | Admit: 2024-09-27 | Discharge: 2024-09-27 | Disposition: A | Discharge status: Home / Self Care | Attending: Family Medicine | Admitting: Family Medicine

## 2024-09-27 DIAGNOSIS — N898 Other specified noninflammatory disorders of vagina: Secondary | ICD-10-CM | POA: Insufficient documentation

## 2024-09-27 DIAGNOSIS — Z113 Encounter for screening for infections with a predominantly sexual mode of transmission: Secondary | ICD-10-CM | POA: Insufficient documentation

## 2024-09-27 LAB — POCT URINE DIPSTICK
Bilirubin, UA: NEGATIVE
Blood, UA: NEGATIVE
Glucose, UA: NEGATIVE mg/dL
Ketones, POC UA: NEGATIVE mg/dL
Leukocytes, UA: NEGATIVE
Nitrite, UA: NEGATIVE
POC PROTEIN,UA: NEGATIVE
Spec Grav, UA: 1.01 (ref 1.010–1.025)
Urobilinogen, UA: 0.2 U/dL
pH, UA: 6 (ref 5.0–8.0)

## 2024-09-27 LAB — POCT URINE PREGNANCY: Preg Test, Ur: NEGATIVE

## 2024-09-27 NOTE — ED Provider Notes (Signed)
 [facility]  Note:  This document was prepared using Conservation officer, historic buildings and may include unintentional dictation errors.  MRN: [id number] DOB: [birth year]  Subjective:   Sheila Brennan is a 18 y.o. female presenting for STI screening.  Patient was just treated for BV and yeast infection about 2 weeks ago.  Denies fever, n/v, abdominal pain, pelvic pain, rashes, dysuria, urinary frequency, hematuria, vaginal discharge.  Would like a pregnancy test.  Would like vaginal cytology.  Is requesting urinalysis for UTI despite not having symptoms.  She is wondering about antibiotic prophylaxis after having sex to prevent sexually transmitted infections.  Is also wondering about PrEP.  No current facility-administered medications for this encounter.  Current Outpatient Medications:    fluconazole  (DIFLUCAN ) 150 MG tablet, Take 1 tablet (150 mg total) by mouth every 3 (three) days., Disp: 2 tablet, Rfl: 0   metroNIDAZOLE  (FLAGYL ) 500 MG tablet, Take 1 tablet (500 mg total) by mouth 2 (two) times daily., Disp: 14 tablet, Rfl: 0   valACYclovir  (VALTREX ) 1000 MG tablet, At the start of an outbreak take 1 tablet daily for 5 days., Disp: 30 tablet, Rfl: 5   No Known Allergies  Past Medical History:  Diagnosis Date   Chlamydia    Hypertension    Trichomonas infection    UTI (urinary tract infection)      Past Surgical History:  Procedure Laterality Date   NO PAST SURGERIES     TONSILLECTOMY      Family History  Problem Relation Age of Onset   Hypertension Mother    Diabetes Mother    Other Father        work accident   Asthma Neg Hx    Cancer Neg Hx    Hearing loss Neg Hx    Stroke Neg Hx     Social History   Tobacco Use   Smoking status: Never   Smokeless tobacco: Never  Vaping Use   Vaping status: Never Used  Substance Use Topics   Alcohol use: Yes    Comment: occ   Drug use: Not Currently    ROS   Objective:   Vitals: BP (!)  150/85 (BP Location: Right Arm)   Pulse 77   Temp 98.3 F (36.8 C) (Oral)   Resp 16   LMP 09/16/2024 (Approximate)   SpO2 98%   Physical Exam Constitutional:      General: She is not in acute distress.    Appearance: Normal appearance. She is well-developed. She is not ill-appearing, toxic-appearing or diaphoretic.  HENT:     Head: Normocephalic and atraumatic.     Nose: Nose normal.     Mouth/Throat:     Mouth: Mucous membranes are moist.  Eyes:     General: No scleral icterus.       Right eye: No discharge.        Left eye: No discharge.     Extraocular Movements: Extraocular movements intact.     Conjunctiva/sclera: Conjunctivae normal.  Cardiovascular:     Rate and Rhythm: Normal rate.  Pulmonary:     Effort: Pulmonary effort is normal.  Abdominal:     General: Bowel sounds are normal. There is no distension.     Palpations: Abdomen is soft. There is no mass.     Tenderness: There is no abdominal tenderness. There is no right CVA tenderness, left CVA tenderness, guarding or rebound.  Skin:    General: Skin is warm and dry.  Neurological:     General: No focal deficit present.     Mental Status: She is alert and oriented to person, place, and time.  Psychiatric:        Mood and Affect: Mood normal.        Behavior: Behavior normal.        Thought Content: Thought content normal.        Judgment: Judgment normal.     Results for orders placed or performed during the hospital encounter of 09/27/24 (from the past 24 hours)  POCT URINE DIPSTICK     Status: None   Collection Time: 09/27/24  8:31 AM  Result Value Ref Range   Color, UA yellow yellow   Clarity, UA clear clear   Glucose, UA negative negative mg/dL   Bilirubin, UA negative negative   Ketones, POC UA negative negative mg/dL   Spec Grav, UA 8.989 8.989 - 1.025   Blood, UA negative negative   pH, UA 6.0 5.0 - 8.0   POC PROTEIN,UA negative negative, trace   Urobilinogen, UA 0.2 0.2 or 1.0 E.U./dL    Nitrite, UA Negative Negative   Leukocytes, UA Negative Negative  POCT urine pregnancy     Status: None   Collection Time: 09/27/24  8:31 AM  Result Value Ref Range   Preg Test, Ur Negative Negative    Assessment and Plan :   PDMP not reviewed this encounter.  1. Vaginal odor   2. Screen for STD (sexually transmitted disease)    Should patient still test positive for bacterial vaginosis, recommend using clindamycin instead of metronidazole .  Would also coupled this with fluconazole  to help against antibiotic associated yeast infection given that this would potentially be her second round within 1 month.  Had extensive discussion with patient about safe sex practices as opposed to antibiotic prophylaxis to prevent sexually transmitted infections.  Recommended she follow-up with her PCP to discuss the use of PrEP.     Christopher Savannah, [place] 09/27/24 [phone]

## 2024-09-27 NOTE — ED Triage Notes (Addendum)
 Pt requesting STD testing-denies sx-states she was treated for BV and yeast ~1 month-NAD-steady gait-also requesting urine test for UTI and preg test

## 2024-09-27 NOTE — Discharge Instructions (Addendum)
 Your test results should be available tomorrow. If you still test positive for BV infection, then we should use clindamycin as a different antibiotic from metronidazole  since you just took this antibiotic.

## 2024-09-28 LAB — HIV ANTIBODY (ROUTINE TESTING W REFLEX): HIV Screen 4th Generation wRfx: NONREACTIVE

## 2024-09-28 LAB — CERVICOVAGINAL ANCILLARY ONLY
Bacterial Vaginitis (gardnerella): NEGATIVE
Chlamydia: NEGATIVE
Comment: NEGATIVE
Comment: NEGATIVE
Comment: NEGATIVE
Comment: NORMAL
Neisseria Gonorrhea: NEGATIVE
Trichomonas: NEGATIVE

## 2024-09-28 LAB — RPR: RPR Ser Ql: NONREACTIVE

## 2024-11-02 ENCOUNTER — Ambulatory Visit
Admission: EM | Admit: 2024-11-02 | Discharge: 2024-11-02 | Disposition: A | Discharge status: Home / Self Care | Attending: Nurse Practitioner | Admitting: Nurse Practitioner

## 2024-11-02 DIAGNOSIS — R350 Frequency of micturition: Secondary | ICD-10-CM | POA: Diagnosis present

## 2024-11-02 DIAGNOSIS — Z113 Encounter for screening for infections with a predominantly sexual mode of transmission: Secondary | ICD-10-CM | POA: Insufficient documentation

## 2024-11-02 LAB — POCT URINE DIPSTICK
Bilirubin, UA: NEGATIVE
Glucose, UA: NEGATIVE mg/dL
Ketones, POC UA: NEGATIVE mg/dL
Nitrite, UA: NEGATIVE
POC PROTEIN,UA: NEGATIVE
Spec Grav, UA: 1.015 (ref 1.010–1.025)
Urobilinogen, UA: 0.2 U/dL
pH, UA: 7 (ref 5.0–8.0)

## 2024-11-02 LAB — POCT URINE PREGNANCY: Preg Test, Ur: NEGATIVE

## 2024-11-02 MED ORDER — NITROFURANTOIN MONOHYD MACRO 100 MG PO CAPS: 100.0000 mg | ORAL_CAPSULE | Freq: Two times a day (BID) | ORAL | 0 refills | Status: AC

## 2024-11-02 NOTE — ED Triage Notes (Signed)
 Pt requesting STD testing-c/o urinary freq x 2 days-NAD-steady gait

## 2024-11-02 NOTE — Discharge Instructions (Addendum)
 You were seen today for urinary frequency and STD screening. The results of your urine test suggests a possible urinary tract infection (UTI). You have been prescribed Macrobid  to treat the infection. Take the antibiotic exactly as prescribed and complete the full course, even if you start feeling better. A urine culture has been sent to identify the specific bacteria causing the infection and to confirm that the prescribed antibiotic is appropriate. Tests were also performed today to check for bacteria, yeast, gonorrhea, chlamydia, and trichomonas.  While results are pending, it is important that you avoid any sexual activity until your test results have returned, your treatment is complete, and your symptoms have fully resolved. You will only be contacted if any of your test results are positive. You can also review your results through your MyChart account. During this time, avoid douching or using vaginal sprays or deodorants. Wear cotton or cotton-lined underwear to improve airflow and reduce moisture. Be sure to stay hydrated by drinking plenty of fluids. See your regular doctor or gynecologist if your symptoms do not start to improve with treatment.

## 2024-11-02 NOTE — ED Provider Notes (Signed)
 [facility]    CSN: [id number] Arrival date & time: 11/02/24  0804      History   Chief Complaint Chief Complaint  Patient presents with   SEXUALLY TRANSMITTED DISEASE   Urinary Frequency    HPI Sheila Brennan is a 18 y.o. female.   Discussed the use of AI scribe software for clinical note transcription with the patient, who declined its use for this encounter.   The patient presents requesting STD testing. She reports urinary frequency and a sensation of incomplete bladder emptying with urination. She denies dysuria, vaginal itching, irritation, or odor. She notes clear, thin vaginal discharge without odor, which she states is normal for her. She denies fever, abdominal pain, nausea, vomiting, flank pain, or low back pain. The patient is sexually active with one female partner and reports intermittent condom use. She denies any known or suspected sexually transmitted infection exposure. Her last menstrual period was approximately 10/19/2024, and she is not currently on birth control.  The following sections of the patient's history were reviewed and updated as appropriate: allergies, current medications, past family history, past medical history, past social history, past surgical history, and problem list.      Past Medical History:  Diagnosis Date   Chlamydia    Hypertension    Trichomonas infection    UTI (urinary tract infection)     Patient Active Problem List   Diagnosis Date Noted   Hypertension in pregnancy, preeclampsia, severe, delivered 09/04/2022   Vaginal delivery 09/04/2022   Chronic hypertension with superimposed pre-eclampsia 09/03/2022   Excessive weight gain during pregnancy in second trimester 07/08/2022   Chronic hypertension affecting pregnancy 04/15/2022   Chlamydia trachomatis infection in pregnancy in first trimester 04/15/2022   High risk teen pregnancy 03/24/2022    Past Surgical History:  Procedure Laterality Date   NO PAST  SURGERIES     TONSILLECTOMY      OB History     Gravida  1   Para  1   Term  0   Preterm  1   AB  0   Living  1      SAB  0   IAB  0   Ectopic  0   Multiple  0   Live Births  1            Home Medications    Prior to Admission medications   Medication Sig Start Date End Date Taking? Authorizing Provider  nitrofurantoin , macrocrystal-monohydrate, (MACROBID ) 100 MG capsule Take 1 capsule (100 mg total) by mouth 2 (two) times daily. 11/02/24  Yes Iola Lukes, FNP  valACYclovir  (VALTREX ) 1000 MG tablet At the start of an outbreak take 1 tablet daily for 5 days. 10/04/23   Christopher Savannah, PA-C    Family History Family History  Problem Relation Age of Onset   Hypertension Mother    Diabetes Mother    Other Father        work accident   Asthma Neg Hx    Cancer Neg Hx    Hearing loss Neg Hx    Stroke Neg Hx     Social History Social History   Tobacco Use   Smoking status: Never   Smokeless tobacco: Never  Vaping Use   Vaping status: Never Used  Substance Use Topics   Alcohol use: Yes    Comment: occ   Drug use: Not Currently     Allergies   Patient has no known allergies.   Review of  Systems Review of Systems  Constitutional:  Negative for fever.  Gastrointestinal:  Negative for abdominal pain, nausea and vomiting.  Genitourinary:  Positive for frequency and vaginal discharge (clear, thin, no odor). Negative for dysuria and menstrual problem (LMP around 10/19/24).       No vaginal itching, irritation or odor. Feels as if she doesn't completely empty the bladder when voiding   Musculoskeletal:  Negative for back pain.  All other systems reviewed and are negative.    Physical Exam Triage Vital Signs ED Triage Vitals  Encounter Vitals Group     BP 11/02/24 0825 134/81     Girls Systolic BP Percentile --      Girls Diastolic BP Percentile --      Boys Systolic BP Percentile --      Boys Diastolic BP Percentile --      Pulse Rate  11/02/24 0825 77     Resp 11/02/24 0825 16     Temp 11/02/24 0825 98.1 F (36.7 C)     Temp Source 11/02/24 0825 Oral     SpO2 11/02/24 0825 100 %     Weight --      Height --      Head Circumference --      Peak Flow --      Pain Score 11/02/24 0822 0     Pain Loc --      Pain Education --      Exclude from Growth Chart --    No data found.  Updated Vital Signs BP 134/81 (BP Location: Right Arm)   Pulse 77   Temp 98.1 F (36.7 C) (Oral)   Resp 16   LMP 10/19/2024   SpO2 100%   Visual Acuity Right Eye Distance:   Left Eye Distance:   Bilateral Distance:    Right Eye Near:   Left Eye Near:    Bilateral Near:     Physical Exam Constitutional:      General: She is not in acute distress.    Appearance: Normal appearance. She is not ill-appearing, toxic-appearing or diaphoretic.  HENT:     Head: Normocephalic.     Nose: Nose normal.     Mouth/Throat:     Mouth: Mucous membranes are moist.  Eyes:     Conjunctiva/sclera: Conjunctivae normal.  Cardiovascular:     Rate and Rhythm: Normal rate.  Pulmonary:     Effort: Pulmonary effort is normal.  Abdominal:     Palpations: Abdomen is soft.  Genitourinary:    Comments: Deferred; patient performed self-swab for Aptima testing  Musculoskeletal:        General: Normal range of motion.     Cervical back: Normal range of motion and neck supple.  Skin:    General: Skin is warm and dry.  Neurological:     General: No focal deficit present.     Mental Status: She is alert and oriented to person, place, and time.  Psychiatric:        Mood and Affect: Mood normal.        Behavior: Behavior normal.      UC Treatments / Results  Labs (all labs ordered are listed, but only abnormal results are displayed) Labs Reviewed  POCT URINE DIPSTICK - Abnormal; Notable for the following components:      Result Value   Clarity, UA hazy (*)    Blood, UA trace-intact (*)    Leukocytes, UA Moderate (2+) (*)    All other  components within normal limits  POCT URINE PREGNANCY - Normal  URINE CULTURE  CERVICOVAGINAL ANCILLARY ONLY    EKG   Radiology No results found.  Procedures Procedures (including critical care time)  Medications Ordered in UC Medications - No data to display  Initial Impression / Assessment and Plan / UC Course  I have reviewed the triage vital signs and the nursing notes.  Pertinent labs & imaging results that were available during my care of the patient were reviewed by me and considered in my medical decision making (see chart for details).     The patient presents with urinary frequency and a sensation of incomplete bladder emptying and requests STD screening. Urinalysis is suggestive of an acute uncomplicated urinary tract infection. Given symptoms and initial urine findings, empiric treatment was initiated with nitrofurantoin  (Macrobid ). A urine culture was sent to identify the causative organism and confirm antibiotic susceptibility. Comprehensive STI testing was obtained, including evaluation for bacterial vaginosis, yeast, gonorrhea, chlamydia, and trichomonas, with results pending. Patient declined blood testing for STIs. The patient was counseled to abstain from sexual activity until results are available, treatment is completed, and symptoms have resolved. Supportive care measures including hydration and avoidance of vaginal irritants were reviewed. The patient was advised to follow up with primary care or gynecology if symptoms fail to improve or worsen despite treatment. She was instructed that she would be notified of any positive STI results and may also review all results via the patient portal.  Today's evaluation has revealed no signs of a dangerous process. Discussed diagnosis with patient and/or guardian. Patient and/or guardian aware of their diagnosis, possible red flag symptoms to watch out for and need for close follow up. Patient and/or guardian understands  verbal and written discharge instructions. Patient and/or guardian comfortable with plan and disposition.  Patient and/or guardian has a clear mental status at this time, good insight into illness (after discussion and teaching) and has clear judgment to make decisions regarding their care  Documentation was completed with the aid of voice recognition software. Transcription may contain typographical errors.  Final Clinical Impressions(s) / UC Diagnoses   Final diagnoses:  Urinary frequency  Screen for STD (sexually transmitted disease)     Discharge Instructions      You were seen today for urinary frequency and STD screening. The results of your urine test suggests a possible urinary tract infection (UTI). You have been prescribed Macrobid  to treat the infection. Take the antibiotic exactly as prescribed and complete the full course, even if you start feeling better. A urine culture has been sent to identify the specific bacteria causing the infection and to confirm that the prescribed antibiotic is appropriate. Tests were also performed today to check for bacteria, yeast, gonorrhea, chlamydia, and trichomonas.  While results are pending, it is important that you avoid any sexual activity until your test results have returned, your treatment is complete, and your symptoms have fully resolved. You will only be contacted if any of your test results are positive. You can also review your results through your MyChart account. During this time, avoid douching or using vaginal sprays or deodorants. Wear cotton or cotton-lined underwear to improve airflow and reduce moisture. Be sure to stay hydrated by drinking plenty of fluids. See your regular doctor or gynecologist if your symptoms do not start to improve with treatment.            ED Prescriptions     Medication Sig Dispense Auth. Provider  nitrofurantoin , macrocrystal-monohydrate, (MACROBID ) 100 MG capsule Take 1 capsule (100 mg total)  by mouth 2 (two) times daily. 10 capsule Iola Lukes, FNP      PDMP not reviewed this encounter.   Iola [place], [place] 11/02/24 [phone]

## 2024-11-03 ENCOUNTER — Ambulatory Visit (HOSPITAL_COMMUNITY): Payer: Self-pay

## 2024-11-03 LAB — CERVICOVAGINAL ANCILLARY ONLY
Bacterial Vaginitis (gardnerella): NEGATIVE
Candida Glabrata: NEGATIVE
Candida Vaginitis: NEGATIVE
Chlamydia: NEGATIVE
Comment: NEGATIVE
Comment: NEGATIVE
Comment: NEGATIVE
Comment: NEGATIVE
Comment: NEGATIVE
Comment: NORMAL
Neisseria Gonorrhea: NEGATIVE
Trichomonas: NEGATIVE

## 2024-11-03 LAB — URINE CULTURE: Culture: >=100000 — AB
